# Patient Record
Sex: Male | Born: 1960
Health system: Southern US, Community
[De-identification: ages and names within clinical notes are randomized; demographics above are authoritative.]

## PROBLEM LIST (undated history)

## (undated) ENCOUNTER — Ambulatory Visit (HOSPITAL_COMMUNITY): Admission: EM | Payer: 59

## (undated) DIAGNOSIS — I10 Essential (primary) hypertension: Secondary | ICD-10-CM

## (undated) DIAGNOSIS — M549 Dorsalgia, unspecified: Secondary | ICD-10-CM

## (undated) HISTORY — PX: LEG SURGERY: SHX1003

---

## 1999-02-01 ENCOUNTER — Encounter: Admission: RE | Admit: 1999-02-01 | Discharge: 1999-03-16 | Payer: Self-pay | Admitting: Orthopedic Surgery

## 2001-03-15 ENCOUNTER — Emergency Department (HOSPITAL_COMMUNITY): Admission: EM | Admit: 2001-03-15 | Discharge: 2001-03-15 | Payer: Self-pay | Admitting: Emergency Medicine

## 2001-04-11 ENCOUNTER — Emergency Department (HOSPITAL_COMMUNITY): Admission: EM | Admit: 2001-04-11 | Discharge: 2001-04-11 | Payer: Self-pay | Admitting: Emergency Medicine

## 2001-05-10 ENCOUNTER — Emergency Department (HOSPITAL_COMMUNITY): Admission: EM | Admit: 2001-05-10 | Discharge: 2001-05-11 | Payer: Self-pay | Admitting: Emergency Medicine

## 2001-09-13 ENCOUNTER — Emergency Department (HOSPITAL_COMMUNITY): Admission: EM | Admit: 2001-09-13 | Discharge: 2001-09-13 | Payer: Self-pay | Admitting: Emergency Medicine

## 2002-04-01 ENCOUNTER — Emergency Department (HOSPITAL_COMMUNITY): Admission: EM | Admit: 2002-04-01 | Discharge: 2002-04-01 | Payer: Self-pay | Admitting: *Deleted

## 2004-07-12 ENCOUNTER — Ambulatory Visit (HOSPITAL_COMMUNITY): Admission: RE | Admit: 2004-07-12 | Discharge: 2004-07-12 | Payer: Self-pay | Admitting: Cardiovascular Disease

## 2004-11-16 ENCOUNTER — Emergency Department (HOSPITAL_COMMUNITY): Admission: EM | Admit: 2004-11-16 | Discharge: 2004-11-16 | Payer: Self-pay | Admitting: Emergency Medicine

## 2004-11-21 ENCOUNTER — Emergency Department (HOSPITAL_COMMUNITY): Admission: EM | Admit: 2004-11-21 | Discharge: 2004-11-21 | Payer: Self-pay | Admitting: Emergency Medicine

## 2005-05-24 ENCOUNTER — Emergency Department (HOSPITAL_COMMUNITY): Admission: EM | Admit: 2005-05-24 | Discharge: 2005-05-24 | Payer: Self-pay | Admitting: Emergency Medicine

## 2005-06-15 ENCOUNTER — Emergency Department (HOSPITAL_COMMUNITY): Admission: EM | Admit: 2005-06-15 | Discharge: 2005-06-15 | Payer: Self-pay | Admitting: Emergency Medicine

## 2008-09-16 ENCOUNTER — Emergency Department (HOSPITAL_COMMUNITY): Admission: EM | Admit: 2008-09-16 | Discharge: 2008-09-16 | Payer: Self-pay | Admitting: Emergency Medicine

## 2008-10-20 ENCOUNTER — Emergency Department (HOSPITAL_COMMUNITY): Admission: EM | Admit: 2008-10-20 | Discharge: 2008-10-20 | Payer: Self-pay | Admitting: Emergency Medicine

## 2010-06-26 ENCOUNTER — Emergency Department (HOSPITAL_COMMUNITY): Admission: EM | Admit: 2010-06-26 | Discharge: 2010-06-26 | Payer: Self-pay | Admitting: Emergency Medicine

## 2011-08-25 LAB — DIFFERENTIAL
Eosinophils Relative: 3 % (ref 0–5)
Lymphs Abs: 2.2 10*3/uL (ref 0.7–4.0)
Neutrophils Relative %: 75 % (ref 43–77)

## 2011-08-25 LAB — URINALYSIS, ROUTINE W REFLEX MICROSCOPIC
Bilirubin Urine: NEGATIVE
Glucose, UA: NEGATIVE mg/dL
Ketones, ur: NEGATIVE mg/dL
Specific Gravity, Urine: 1.018 (ref 1.005–1.030)
pH: 6 (ref 5.0–8.0)

## 2011-08-25 LAB — COMPREHENSIVE METABOLIC PANEL
Alkaline Phosphatase: 67 U/L (ref 39–117)
CO2: 29 mEq/L (ref 19–32)
Calcium: 9.3 mg/dL (ref 8.4–10.5)
Chloride: 106 mEq/L (ref 96–112)
Creatinine, Ser: 0.93 mg/dL (ref 0.4–1.5)
GFR calc non Af Amer: >60 mL/min (ref >60)
Glucose, Bld: 90 mg/dL (ref 70–99)
Potassium: 3.8 mEq/L (ref 3.5–5.1)
Total Protein: 6.9 g/dL (ref 6.0–8.3)

## 2011-08-25 LAB — CBC
HCT: 45.2 % (ref 39.0–52.0)
Hemoglobin: 15.2 g/dL (ref 13.0–17.0)
MCHC: 33.6 g/dL (ref 30.0–36.0)
RBC: 4.66 MIL/uL (ref 4.22–5.81)
WBC: 14.9 10*3/uL — ABNORMAL HIGH (ref 4.0–10.5)

## 2012-09-16 ENCOUNTER — Emergency Department (HOSPITAL_COMMUNITY)
Admission: EM | Admit: 2012-09-16 | Discharge: 2012-09-17 | Disposition: A | Discharge status: Home / Self Care | Payer: Medicare Other | Attending: Emergency Medicine | Admitting: Emergency Medicine

## 2012-09-16 ENCOUNTER — Encounter (HOSPITAL_COMMUNITY): Payer: Self-pay | Admitting: Cardiology

## 2012-09-16 ENCOUNTER — Emergency Department (HOSPITAL_COMMUNITY): Payer: Medicare Other

## 2012-09-16 DIAGNOSIS — F17200 Nicotine dependence, unspecified, uncomplicated: Secondary | ICD-10-CM | POA: Insufficient documentation

## 2012-09-16 DIAGNOSIS — Z79899 Other long term (current) drug therapy: Secondary | ICD-10-CM | POA: Insufficient documentation

## 2012-09-16 DIAGNOSIS — K5792 Diverticulitis of intestine, part unspecified, without perforation or abscess without bleeding: Secondary | ICD-10-CM

## 2012-09-16 DIAGNOSIS — K5732 Diverticulitis of large intestine without perforation or abscess without bleeding: Secondary | ICD-10-CM | POA: Insufficient documentation

## 2012-09-16 DIAGNOSIS — R42 Dizziness and giddiness: Secondary | ICD-10-CM | POA: Insufficient documentation

## 2012-09-16 LAB — URINALYSIS, ROUTINE W REFLEX MICROSCOPIC
Bilirubin Urine: NEGATIVE
Glucose, UA: NEGATIVE mg/dL
Ketones, ur: NEGATIVE mg/dL
Urobilinogen, UA: 0.2 mg/dL (ref 0.0–1.0)

## 2012-09-16 LAB — BASIC METABOLIC PANEL
BUN: 15 mg/dL (ref 6–23)
CO2: 25 mEq/L (ref 19–32)
Chloride: 105 mEq/L (ref 96–112)
Creatinine, Ser: 0.9 mg/dL (ref 0.50–1.35)
GFR calc non Af Amer: >90 mL/min (ref >90)
Glucose, Bld: 101 mg/dL — ABNORMAL HIGH (ref 70–99)
Potassium: 3.9 mEq/L (ref 3.5–5.1)
Sodium: 140 mEq/L (ref 135–145)

## 2012-09-16 LAB — CBC
HCT: 45.4 % (ref 39.0–52.0)
MCH: 33 pg (ref 26.0–34.0)
MCHC: 35 g/dL (ref 30.0–36.0)

## 2012-09-16 MED ORDER — METRONIDAZOLE 500 MG PO TABS
500.0000 mg | ORAL_TABLET | Freq: Once | ORAL | Status: AC
  Administered 2012-09-17: 500 mg via ORAL
  Filled 2012-09-16: qty 1

## 2012-09-16 MED ORDER — CIPROFLOXACIN IN D5W 400 MG/200ML IV SOLN: 400.0000 mg | Freq: Once | INTRAVENOUS | Status: DC

## 2012-09-16 MED ORDER — MORPHINE SULFATE 4 MG/ML IJ SOLN
4.0000 mg | Freq: Once | INTRAMUSCULAR | Status: AC
  Administered 2012-09-16: 4 mg via INTRAVENOUS
  Filled 2012-09-16: qty 1

## 2012-09-16 MED ORDER — CIPROFLOXACIN HCL 500 MG PO TABS
500.0000 mg | ORAL_TABLET | Freq: Once | ORAL | Status: AC
  Administered 2012-09-17: 500 mg via ORAL
  Filled 2012-09-16: qty 1

## 2012-09-16 MED ORDER — METRONIDAZOLE IN NACL 5-0.79 MG/ML-% IV SOLN: 500.0000 mg | Freq: Once | INTRAVENOUS | Status: DC

## 2012-09-16 MED ORDER — IOHEXOL 300 MG/ML  SOLN
100.0000 mL | Freq: Once | INTRAMUSCULAR | Status: AC | PRN
  Administered 2012-09-16: 100 mL via INTRAVENOUS

## 2012-09-16 MED ORDER — IOHEXOL 300 MG/ML  SOLN
20.0000 mL | INTRAMUSCULAR | Status: AC
  Administered 2012-09-16 – 2012-09-17 (×2): 20 mL via ORAL

## 2012-09-16 NOTE — ED Notes (Signed)
Pt reports lower quadrant abd pain that started last week. Reports increased urination and pain into the left flank area. Denies any n/v, reports normal bowel movement.

## 2012-09-16 NOTE — ED Notes (Addendum)
MD at bedside. 

## 2012-09-16 NOTE — ED Provider Notes (Signed)
History     CSN: [id number]  Arrival date & time 09/16/12  1734   First MD Initiated Contact with Patient 09/16/12 2039      Chief Complaint  Patient presents with  . Abdominal Pain    (Consider location/radiation/quality/duration/timing/severity/associated sxs/prior treatment) Patient is a 51 y.o. male presenting with abdominal pain. The history is provided by the patient. No language interpreter was used.  Abdominal Pain The primary symptoms of the illness include abdominal pain. The primary symptoms of the illness do not include fever, fatigue, shortness of breath, nausea, vomiting, diarrhea, hematemesis, hematochezia or dysuria. The current episode started more than 2 days ago. The onset of the illness was gradual. The problem has been gradually worsening.  The abdominal pain began more than 2 days ago. The pain came on gradually. The abdominal pain has been gradually worsening since its onset. The abdominal pain is located in the LLQ. The abdominal pain radiates to the left flank. The severity of the abdominal pain is 6/10. The abdominal pain is relieved by nothing. The abdominal pain is exacerbated by movement.  The patient has not had a change in bowel habit. Symptoms associated with the illness do not include chills, anorexia, diaphoresis, constipation or back pain. Significant associated medical issues do not include PUD, GERD, inflammatory bowel disease or diabetes.    History reviewed. No pertinent past medical history.  History reviewed. No pertinent past surgical history.  History reviewed. No pertinent family history.  History  Substance Use Topics  . Smoking status: Current Every Day Smoker  . Smokeless tobacco: Not on file  . Alcohol Use: Yes      Review of Systems  Constitutional: Negative for fever, chills, diaphoresis, activity change, appetite change and fatigue.  HENT: Negative for sore throat and neck pain.   Eyes: Negative for discharge and visual  disturbance.  Respiratory: Negative for cough, choking and shortness of breath.   Cardiovascular: Negative for chest pain and leg swelling.  Gastrointestinal: Positive for abdominal pain. Negative for nausea, vomiting, diarrhea, constipation, hematochezia, anorexia and hematemesis.  Genitourinary: Negative for dysuria and difficulty urinating.  Musculoskeletal: Negative for back pain and arthralgias.  Skin: Negative for color change and pallor.  Neurological: Positive for light-headedness (worse when standing). Negative for dizziness and speech difficulty.  Psychiatric/Behavioral: Negative for behavioral problems and agitation.  All other systems reviewed and are negative.    Allergies  Review of patient's allergies indicates no known allergies.  Home Medications   Current Outpatient Rx  Name Route Sig Dispense Refill  . CALCIUM CARBONATE ANTACID 500 MG PO CHEW Oral Chew 1 tablet by mouth daily.    Marland Kitchen ESOMEPRAZOLE MAGNESIUM 40 MG PO CPDR Oral Take 40 mg by mouth daily before breakfast.      BP 149/93  Pulse 72  Temp 98.1 F (36.7 C) (Oral)  Resp 22  SpO2 95%  Physical Exam  Constitutional: He appears well-developed. No distress.  HENT:  Head: Normocephalic and atraumatic.  Mouth/Throat: No oropharyngeal exudate.  Eyes: EOM are normal. Pupils are equal, round, and reactive to light. Right eye exhibits no discharge. Left eye exhibits no discharge.  Neck: Normal range of motion. Neck supple. No JVD present.  Cardiovascular: Normal rate, regular rhythm and normal heart sounds.   Pulmonary/Chest: Effort normal and breath sounds normal. No stridor. No respiratory distress. He has no wheezes. He has no rales. He exhibits no tenderness.  Abdominal: Soft. Bowel sounds are normal. He exhibits no distension and no mass. There  is tenderness (LLQ, moderate). There is no rebound and no guarding.  Genitourinary: Penis normal.  Musculoskeletal: Normal range of motion. He exhibits no edema  and no tenderness.  Neurological: He is alert. No cranial nerve deficit. He exhibits normal muscle tone.  Skin: Skin is warm and dry. He is not diaphoretic. No erythema. No pallor.  Psychiatric: He has a normal mood and affect. His behavior is normal. Judgment and thought content normal.    ED Course  Procedures (including critical care time)  Labs Reviewed  CBC - Abnormal; Notable for the following:    WBC 13.0 (*)     All other components within normal limits  BASIC METABOLIC PANEL - Abnormal; Notable for the following:    Glucose, Bld 101 (*)     All other components within normal limits  URINALYSIS, ROUTINE W REFLEX MICROSCOPIC   Ct Abdomen Pelvis W Contrast  09/16/2012  *RADIOLOGY REPORT*  Clinical Data: Lower quadrant abdominal pain left flank pain.  CT ABDOMEN AND PELVIS WITH CONTRAST  Technique:  Multidetector CT imaging of the abdomen and pelvis was performed following the standard protocol during bolus administration of intravenous contrast.  Contrast: OMNIPAQUE IOHEXOL 300 MG/ML  SOLN  Comparison: CT abdomen pelvis 10/20/2008.  Findings: Minimal atelectasis is present at the lung bases.  The heart size is normal.  No significant pleural or pericardial effusion is present.  Diffuse fatty infiltration of the liver is evident.  The spleen is unremarkable.  The stomach, duodenum, and pancreas are within normal limits the common bile duct and gallbladder are normal.  The adrenal glands are normal bilaterally.  The kidneys and ureters are within normal limits.  The urinary bladder is unremarkable.  Diverticular changes are present within the sigmoid colon.  There is focal inflammation associated with the descending colon compatible with a focal acute colitis.  There is no free air or fluid to suggest perforation or abscess.  The more proximal colon is unremarkable.  The appendix is visualized and normal.  The small bowel is unremarkable.  Atherosclerotic calcifications are present in  the abdominal aorta branch vessels without aneurysm.  A small amount of fat is herniated into the left inguinal canal.  There is no associated bowel.  Bone windows demonstrate bilateral L5 pars defects.  No significant anterolisthesis is evident.  IMPRESSION: 1.  Focal descending colitis likely reflects diverticulitis. 2.  Additional diverticular changes within the sigmoid colon without other areas of diverticulitis. 3.  Diffuse fatty infiltration of the liver. 4.  Bilateral L5 pars defects without significant anterolisthesis. 5.  Mild atherosclerotic change.   Original Report Authenticated By: Jamesetta Orleans. MATTERN, M.D.      1. Diverticulitis       MDM  8:57 PM patient presents with abdominal pain gradually worsening over the past several days. He localizes it to his left lower quadrant, radiating to his left flank. He's had no other associated symptoms. Review of systems only positive for mild lightheadedness upon standing. Denies vertigo. On exam patient is alert, no acute distress, normal vital signs, mild to moderate tenderness to palpation to left lower quadrant of abdomen. Does not have any guarding, rigidity, peritoneal signs. I have considered and doubt potential causes of abdominal pain including bowel obstruction, bowel perforation, pancreatitis, appendicitis, cholecystitis, intraabdominal abscess, abdominal aortic aneurysm, mesenteric ischemia and hernia.   12:03 AM CT consistent with diverticulitis. Will do course of Cipro and Flagyl for 10 days. Pt deemed stable for discharge. Return precautions were provided and  pt expressed understanding to return to ED if any acute symptoms return. Follow up was instructed which pt also expressed understanding. All questions were answered and pt was in agreement w/ plan.       Warrick Parisian, MD 09/17/12 0003

## 2012-09-17 MED ORDER — CIPROFLOXACIN HCL 500 MG PO TABS: 500.0000 mg | ORAL_TABLET | Freq: Two times a day (BID) | ORAL | Status: DC

## 2012-09-17 MED ORDER — HYDROCODONE-ACETAMINOPHEN 5-325 MG PO TABS: 2.0000 | ORAL_TABLET | Freq: Four times a day (QID) | ORAL | Status: DC | PRN

## 2012-09-17 MED ORDER — DOCUSATE SODIUM 100 MG PO CAPS
100.0000 mg | ORAL_CAPSULE | Freq: Two times a day (BID) | ORAL | Status: DC
Start: 2012-09-17 — End: 2013-08-28

## 2012-09-17 MED ORDER — IBUPROFEN 800 MG PO TABS: 800.0000 mg | ORAL_TABLET | Freq: Three times a day (TID) | ORAL | Status: DC

## 2012-09-17 MED ORDER — METRONIDAZOLE 500 MG PO TABS: 500.0000 mg | ORAL_TABLET | Freq: Three times a day (TID) | ORAL | Status: DC

## 2012-09-17 NOTE — ED Provider Notes (Addendum)
I saw and evaluated the patient, reviewed the resident's note and I agree with the findings and plan.  The patient is well-appearing.  She does have a fever mild tenderness in the right lower quadrant.  CT abdomen pelvis pending.  Summary symptoms sound viral in nature and I suspect that the patient's CT scan is normal then she can go home safely.  She has a primary care Dr. and she guarantees me that she will followup with her primary care physician within 24 hours or return to the ER in 24 hours for recheck.  But the patient and her daughter are agreeable to this plan.  Care to Dr. Dierdre Highman for follow up on CT scan.   Lyanne Co, MD 09/17/12 1610  Lyanne Co, MD 09/17/12 [phone]

## 2013-08-28 ENCOUNTER — Encounter (HOSPITAL_COMMUNITY): Payer: Self-pay | Admitting: Emergency Medicine

## 2013-08-28 ENCOUNTER — Emergency Department (HOSPITAL_COMMUNITY): Payer: Medicare Other

## 2013-08-28 ENCOUNTER — Emergency Department (HOSPITAL_COMMUNITY)
Admission: EM | Admit: 2013-08-28 | Discharge: 2013-08-28 | Disposition: A | Discharge status: Home / Self Care | Payer: Medicare Other | Attending: Emergency Medicine | Admitting: Emergency Medicine

## 2013-08-28 DIAGNOSIS — M25511 Pain in right shoulder: Secondary | ICD-10-CM

## 2013-08-28 DIAGNOSIS — F17200 Nicotine dependence, unspecified, uncomplicated: Secondary | ICD-10-CM | POA: Insufficient documentation

## 2013-08-28 DIAGNOSIS — S46909A Unspecified injury of unspecified muscle, fascia and tendon at shoulder and upper arm level, unspecified arm, initial encounter: Secondary | ICD-10-CM | POA: Insufficient documentation

## 2013-08-28 DIAGNOSIS — S4980XA Other specified injuries of shoulder and upper arm, unspecified arm, initial encounter: Secondary | ICD-10-CM | POA: Insufficient documentation

## 2013-08-28 DIAGNOSIS — Y9389 Activity, other specified: Secondary | ICD-10-CM | POA: Insufficient documentation

## 2013-08-28 DIAGNOSIS — Y929 Unspecified place or not applicable: Secondary | ICD-10-CM | POA: Insufficient documentation

## 2013-08-28 DIAGNOSIS — X503XXA Overexertion from repetitive movements, initial encounter: Secondary | ICD-10-CM | POA: Insufficient documentation

## 2013-08-28 DIAGNOSIS — Z792 Long term (current) use of antibiotics: Secondary | ICD-10-CM | POA: Insufficient documentation

## 2013-08-28 LAB — BASIC METABOLIC PANEL
BUN: 13 mg/dL (ref 6–23)
CO2: 28 mEq/L (ref 19–32)
Chloride: 102 mEq/L (ref 96–112)
GFR calc Af Amer: >90 mL/min (ref >90)
Glucose, Bld: 103 mg/dL — ABNORMAL HIGH (ref 70–99)
Potassium: 4.1 mEq/L (ref 3.5–5.1)
Sodium: 139 mEq/L (ref 135–145)

## 2013-08-28 LAB — CBC WITH DIFFERENTIAL/PLATELET
Basophils Relative: 0 % (ref 0–1)
Eosinophils Absolute: 0.7 10*3/uL (ref 0.0–0.7)
Eosinophils Relative: 6 % — ABNORMAL HIGH (ref 0–5)
HCT: 42.5 % (ref 39.0–52.0)
Hemoglobin: 14.8 g/dL (ref 13.0–17.0)
Lymphocytes Relative: 24 % (ref 12–46)
Lymphs Abs: 2.7 10*3/uL (ref 0.7–4.0)
MCH: 32.2 pg (ref 26.0–34.0)
MCHC: 34.8 g/dL (ref 30.0–36.0)
Neutro Abs: 6.7 10*3/uL (ref 1.7–7.7)
RBC: 4.59 MIL/uL (ref 4.22–5.81)

## 2013-08-28 LAB — TROPONIN I: Troponin I: <0.3 ng/mL (ref <0.30)

## 2013-08-28 MED ORDER — IBUPROFEN 800 MG PO TABS: 800.0000 mg | ORAL_TABLET | Freq: Three times a day (TID) | ORAL | Status: DC

## 2013-08-28 MED ORDER — IBUPROFEN 800 MG PO TABS
800.0000 mg | ORAL_TABLET | Freq: Once | ORAL | Status: AC
  Administered 2013-08-28: 800 mg via ORAL
  Filled 2013-08-28: qty 1

## 2013-08-28 MED ORDER — OXYCODONE-ACETAMINOPHEN 5-325 MG PO TABS
2.0000 | ORAL_TABLET | Freq: Once | ORAL | Status: DC
  Filled 2013-08-28: qty 2

## 2013-08-28 MED ORDER — OXYCODONE-ACETAMINOPHEN 5-325 MG PO TABS
2.0000 | ORAL_TABLET | Freq: Once | ORAL | Status: AC
  Administered 2013-08-28: 2 via ORAL
  Filled 2013-08-28: qty 2

## 2013-08-28 MED ORDER — OXYCODONE-ACETAMINOPHEN 5-325 MG PO TABS: 2.0000 | ORAL_TABLET | ORAL | Status: DC | PRN

## 2013-08-28 MED ORDER — HYDROMORPHONE HCL PF 1 MG/ML IJ SOLN
1.0000 mg | Freq: Once | INTRAMUSCULAR | Status: AC
  Administered 2013-08-28: 1 mg via INTRAMUSCULAR
  Filled 2013-08-28: qty 1

## 2013-08-28 NOTE — ED Notes (Signed)
Phlebotomy at bedside.

## 2013-08-28 NOTE — ED Provider Notes (Signed)
CSN: [id number]     Arrival date & time 08/28/13  0225 History   First MD Initiated Contact with Patient 08/28/13 [phone]     Chief Complaint  Patient presents with  . Arm Pain   (Consider location/radiation/quality/duration/timing/severity/associated sxs/prior Treatment) HPI Comments: Patient complains of right upper arm pain for the past 4 days it has been constant and worse with movement. Onset after he was lifting a heavy table over the weekend. The pain is in the front and back of his upper right arm. Denies any chest pain, neck pain, difficulty breathing or abdominal pain. Denies any numbness or tingling. Feels weak because of pain. He is not taking anything at home for pain.  The history is provided by the patient.    History reviewed. No pertinent past medical history. Past Surgical History  Procedure Laterality Date  . Leg surgery    . Neck surgery     No family history on file. History  Substance Use Topics  . Smoking status: Current Every Day Smoker  . Smokeless tobacco: Not on file  . Alcohol Use: Yes    Review of Systems  Constitutional: Negative for fever, activity change and appetite change.  Respiratory: Negative for cough, chest tightness and shortness of breath.   Cardiovascular: Negative for chest pain.  Gastrointestinal: Negative for nausea, vomiting and abdominal pain.  Genitourinary: Negative for dysuria and hematuria.  Musculoskeletal: Positive for arthralgias and myalgias. Negative for back pain and neck pain.  Skin: Negative for rash.  Neurological: Negative for dizziness and headaches.  A complete 10 system review of systems was obtained and all systems are negative except as noted in the HPI and PMH.    Allergies  Review of patient's allergies indicates no known allergies.  Home Medications   Current Outpatient Rx  Name  Route  Sig  Dispense  Refill  . aspirin 325 MG tablet   Oral   Take 325 mg by mouth every 4 (four) hours as needed for pain.         . calcium carbonate (TUMS - DOSED IN MG ELEMENTAL CALCIUM) 500 MG chewable tablet   Oral   Chew 1 tablet by mouth daily as needed for heartburn.          . ciprofloxacin (CIPRO) 500 MG tablet   Oral   Take 1 tablet (500 mg total) by mouth 2 (two) times daily.   20 tablet   0   . ibuprofen (ADVIL,MOTRIN) 800 MG tablet   Oral   Take 1 tablet (800 mg total) by mouth 3 (three) times daily.   21 tablet   0   . oxyCODONE-acetaminophen (PERCOCET/ROXICET) 5-325 MG per tablet   Oral   Take 2 tablets by mouth every 4 (four) hours as needed for pain.   15 tablet   0    BP 161/93  Pulse 80  Temp(Src) 97.8 F (36.6 C) (Oral)  Resp 25  SpO2 95% Physical Exam  Constitutional: He is oriented to person, place, and time. He appears well-developed and well-nourished. No distress.  HENT:  Head: Normocephalic and atraumatic.  Mouth/Throat: Oropharynx is clear and moist. No oropharyngeal exudate.  Eyes: Conjunctivae and EOM are normal. Pupils are equal, round, and reactive to light.  Neck: Normal range of motion. Neck supple.  Cardiovascular: Normal rate, regular rhythm and normal heart sounds.   No murmur heard. Pulmonary/Chest: Effort normal and breath sounds normal. No respiratory distress.  Abdominal: Soft. There is no tenderness. There is no  rebound and no guarding.  Musculoskeletal: Normal range of motion. He exhibits tenderness. He exhibits no edema.  Tender to palpation over right bicep and tricep. Flexion and extension of elbow intact. Reduced range of motion of right shoulder secondary to pain. +2 radial pulse. Cardinal hand movements intact. Nerve sensation intact. Biceps tendon appears intact.  Neurological: He is alert and oriented to person, place, and time. No cranial nerve deficit. He exhibits normal muscle tone. Coordination normal.  Skin: Skin is warm.    ED Course  Procedures (including critical care time) Labs Review Labs Reviewed  CBC WITH DIFFERENTIAL -  Abnormal; Notable for the following:    WBC 11.2 (*)    Eosinophils Relative 6 (*)    Monocytes Absolute 1.1 (*)    All other components within normal limits  BASIC METABOLIC PANEL - Abnormal; Notable for the following:    Glucose, Bld 103 (*)    All other components within normal limits  TROPONIN I   Imaging Review Dg Shoulder Right  08/28/2013   *RADIOLOGY REPORT*  Clinical Data: Injury to the right arm while lifting object; right arm pain.  RIGHT SHOULDER - 2+ VIEW  Comparison: None.  Findings: There is no evidence of fracture or dislocation.  The right humeral head is seated within the glenoid fossa.  The acromioclavicular joint is unremarkable in appearance.  No significant soft tissue abnormalities are seen.  The visualized portions of the right lung are clear.  IMPRESSION: No evidence of fracture or dislocation.   Original Report Authenticated By: Tonia Ghent, M.D.   Dg Elbow Complete Right  08/28/2013   *RADIOLOGY REPORT*  Clinical Data: Injury to arm while lifting an object.  Pain at the right elbow.  RIGHT ELBOW - COMPLETE 3+ VIEW  Comparison: None.  Findings: There is no evidence of fracture or dislocation.  The visualized joint spaces are preserved.  No significant joint effusion is identified.  The soft tissues are unremarkable in appearance.  IMPRESSION: No evidence of fracture or dislocation.   Original Report Authenticated By: Tonia Ghent, M.D.    MDM   1. Shoulder pain, right    5 day history of arm pain after lifting table. Neurovascular intact. Sensation intact.  EKG nonischemic without comparison. Xrays negative for fracture.  Forearm flexion and extension intact. Pronation, supination intact. Reduced ROM of R shoulder 2/2 pain. Sling, analgesics, f/u with ortho. Early ROM exercises discussed.    Date: 08/28/2013  Rate: 71  Rhythm: normal sinus rhythm  QRS Axis: normal  Intervals: normal  ST/T Wave abnormalities: nonspecific T wave changes  Conduction  Disutrbances:none  Narrative Interpretation: T wave inversion lead III  Old EKG Reviewed: none available    Glynn Octave, MD 08/28/13 [phone]

## 2013-08-28 NOTE — ED Notes (Signed)
Pt. reports right arm pain onset 5 days ago after lifting heavy table.

## 2013-08-28 NOTE — ED Notes (Signed)
PT did not want PO pain medications, MD informed. See new orders.

## 2013-08-28 NOTE — ED Notes (Signed)
MD informed pt is in a lot of pain still.

## 2014-09-01 ENCOUNTER — Encounter (HOSPITAL_COMMUNITY): Payer: Self-pay | Admitting: Emergency Medicine

## 2014-09-01 ENCOUNTER — Emergency Department (HOSPITAL_COMMUNITY)
Admission: EM | Admit: 2014-09-01 | Discharge: 2014-09-02 | Disposition: A | Discharge status: Home / Self Care | Payer: Medicare Other | Attending: Emergency Medicine | Admitting: Emergency Medicine

## 2014-09-01 DIAGNOSIS — Z720 Tobacco use: Secondary | ICD-10-CM | POA: Diagnosis not present

## 2014-09-01 DIAGNOSIS — M5136 Other intervertebral disc degeneration, lumbar region: Secondary | ICD-10-CM | POA: Insufficient documentation

## 2014-09-01 DIAGNOSIS — M549 Dorsalgia, unspecified: Secondary | ICD-10-CM | POA: Diagnosis present

## 2014-09-01 HISTORY — DX: Dorsalgia, unspecified: M54.9

## 2014-09-01 MED ORDER — KETOROLAC TROMETHAMINE 30 MG/ML IJ SOLN
30.0000 mg | Freq: Once | INTRAMUSCULAR | Status: AC
  Administered 2014-09-02: 30 mg via INTRAMUSCULAR
  Filled 2014-09-01: qty 1

## 2014-09-01 MED ORDER — MORPHINE SULFATE 4 MG/ML IJ SOLN
4.0000 mg | Freq: Once | INTRAMUSCULAR | Status: AC
  Administered 2014-09-02: 4 mg via INTRAMUSCULAR
  Filled 2014-09-01: qty 1

## 2014-09-01 NOTE — ED Provider Notes (Signed)
CSN: [id number]     Arrival date & time 09/01/14  2111 History   This chart was scribed for non-physician practitioner Harvie Heck, PA-C,  working with Delora Fuel, MD, by Neta Ehlers, ED Scribe. This patient was seen in room TR11C/TR11C and the patient's care was started at 11:43 PM.  None    Chief Complaint  Patient presents with  . Back Pain    HPI Comments: Derek Carroll is a 53 y.o. male who presents to the Emergency Department complaining of constant lumbar pain which began approximately six hours ago. 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 He reports the pain is worse with movement, weight-bearing, sitting upright, and ambulation. He states the pain is similar to previous back pain, known degenerative changes from OSH XR. Marland Kitchen He has used IBU without resolution.  He denies weakness or numbness to his legs, no radiation into lower extrmities. He also denies nausea, emesis, abdominal pain, fever, bladder or bowel incontinence, dysuria or hematuria.    The history is provided by the patient. No language interpreter was used.    Past Medical History  Diagnosis Date  . Back pain    Past Surgical History  Procedure Laterality Date  . Leg surgery     History reviewed. No pertinent family history. History  Substance Use Topics  . Smoking status: Current Every Day Smoker  . Smokeless tobacco: Not on file  . Alcohol Use: Yes    Review of Systems  Constitutional: Negative for fever and chills.  Cardiovascular: Negative for chest pain.  Gastrointestinal: Negative for nausea, vomiting and abdominal pain.  Genitourinary: Negative for dysuria and hematuria.  Musculoskeletal: Positive for back pain.  Neurological: Negative for weakness and numbness.  All other systems reviewed and are negative.     Allergies  Review of patient's allergies indicates no known allergies.  Home Medications   Prior to Admission medications   Medication Sig  Start Date End Date Taking? Authorizing Provider  ibuprofen (ADVIL,MOTRIN) 200 MG tablet Take 200 mg by mouth every 6 (six) hours as needed for mild pain.   Yes Historical Provider, MD   Triage Vitals: BP 151/96  Pulse 84  Temp(Src) 98.1 F (36.7 C) (Oral)  Resp 20  Ht 5\' 10"  (1.778 m)  Wt 225 lb (102.059 kg)  BMI 32.28 kg/m2  SpO2 93%  Physical Exam  Nursing note and vitals reviewed. Constitutional: He is oriented to person, place, and time. He appears well-developed and well-nourished. No distress.  HENT:  Head: Normocephalic and atraumatic.  Eyes: Conjunctivae and EOM are normal.  Neck: Neck supple.  Pulmonary/Chest: Effort normal. No respiratory distress.  Musculoskeletal: Normal range of motion.       Back:  Diffuse tenderness to lower back. No obvious deformity.  Spasm noted in Left lower back. Normal sensation and strength to lower extremities.  Neurological: He is alert and oriented to person, place, and time.  Skin: Skin is warm and dry.  Psychiatric: He has a normal mood and affect. His behavior is normal.    ED Course  Procedures (including critical care time)   COORDINATION OF CARE:  11:29 PM- Discussed treatment plan with patient, and the patient agreed to the plan.    Labs Review Labs Reviewed - No data to display  Imaging Review Dg Lumbar Spine Complete  09/02/2014   CLINICAL DATA:  Centralized low back pain. No recent injury. Initial encounter.  EXAM: LUMBAR SPINE - COMPLETE 4+ VIEW  COMPARISON:  CT abdomen pelvis - 09/16/2012  FINDINGS: There are 5 non rib-bearing lumbar type vertebral bodies. Normal alignment the lumbar spine. No anterolisthesis or retrolisthesis. No definite pars defects.  Lumbar vertebral body heights are preserved.  There is mild multilevel lumbar spine DDD, worse at L1-L2 and L2-L3 with disc space height loss, endplate irregularity and sclerosis.  Limited visualization the bilateral SI joints and hips in is normal.  Atherosclerotic  plaque within the abdominal aorta. Regional soft tissues appear otherwise normal.  IMPRESSION: 1. No acute findings. 2. Mild multilevel lumbar spine DDD, worse at L1-L2 and L2-L3.   Electronically Signed   By: Sandi Mariscal M.D.   On: 09/02/2014 01:12     EKG Interpretation None      MDM   Final diagnoses:  DDD (degenerative disc disease), lumbar   Patient presents with nontraumatic back pain, no radiation to lower extremity. Worsened with movement. Likely strain. Known history of degenerative disc disease, patient requesting something to "get rid of all the pain right now" and an XR. X-ray shows mostly lumbar spine DDD, worse at L1-L2 and L2-L3. Plan to discharge with anti-inflammatories, narcotic pain medication and followup with an back specialist. Discussed imaging results, and treatment plan with the patient. Return precautions given. Reports understanding and no other concerns at this time.  Patient is stable for discharge at this time.  Meds given in ED:  Medications  morphine 4 MG/ML injection 4 mg (4 mg Intramuscular Given 09/02/14 0020)  ketorolac (TORADOL) 30 MG/ML injection 30 mg (30 mg Intramuscular Given 09/02/14 0020)    Discharge Medication List as of 09/02/2014  1:28 AM    START taking these medications   Details  cyclobenzaprine (FLEXERIL) 10 MG tablet Take 1 tablet (10 mg total) by mouth at bedtime., Starting 09/02/2014, Until Discontinued, Print    HYDROcodone-acetaminophen (NORCO/VICODIN) 5-325 MG per tablet Take 1 tablet by mouth every 4 (four) hours as needed for moderate pain or severe pain., Starting 09/02/2014, Until Discontinued, Print    !! ibuprofen (ADVIL,MOTRIN) 800 MG tablet Take 1 tablet (800 mg total) by mouth 3 (three) times daily with meals., Starting 09/02/2014, Until Discontinued, Print     !! - Potential duplicate medications found. Please discuss with provider.     I personally performed the services described in this documentation, which was  scribed in my presence. The recorded information has been reviewed and is accurate.    Harvie Heck, PA-C 09/03/14 1538

## 2014-09-01 NOTE — ED Notes (Signed)
C/o low back pain after yard work all day. H/o similar. Worse with movement and walking. (denies: nvd, fever, bleeding, loss of control of bowel or bladder, urinary sx or other sx). Took ibuprofen 600mg  at 1730. Took 400mg  at 2030.

## 2014-09-02 ENCOUNTER — Emergency Department (HOSPITAL_COMMUNITY): Payer: Medicare Other

## 2014-09-02 MED ORDER — CYCLOBENZAPRINE HCL 10 MG PO TABS: 10.0000 mg | ORAL_TABLET | Freq: Every day | ORAL | Status: DC

## 2014-09-02 MED ORDER — HYDROCODONE-ACETAMINOPHEN 5-325 MG PO TABS: 1.0000 | ORAL_TABLET | ORAL | Status: DC | PRN

## 2014-09-02 MED ORDER — IBUPROFEN 800 MG PO TABS: 800.0000 mg | ORAL_TABLET | Freq: Three times a day (TID) | ORAL | Status: DC

## 2014-09-02 NOTE — ED Notes (Signed)
Back from xray, NAD, calm, alert, interactive, drinking soda.

## 2014-09-02 NOTE — ED Notes (Signed)
Alert, NAD, calm, interactive, watching TV, asked lights to be dimmed, drinking 2nd can of soda per request. EDPA in to see pt.

## 2014-09-02 NOTE — Discharge Instructions (Signed)
Call for a follow up appointment with a Family or Primary Care Provider.  °Return if Symptoms worsen.   °Take medication as prescribed.  ° °

## 2014-09-02 NOTE — ED Notes (Signed)
Pt alert, NAD, calm, interactive, resps e/u.

## 2014-09-04 NOTE — ED Provider Notes (Signed)
Medical screening examination/treatment/procedure(s) were performed by non-physician practitioner and as supervising physician I was immediately available for consultation/collaboration.     Delora Fuel, MD 50/03/70 [id number]

## 2015-06-11 ENCOUNTER — Emergency Department (HOSPITAL_COMMUNITY)
Admission: EM | Admit: 2015-06-11 | Discharge: 2015-06-11 | Disposition: A | Discharge status: Home / Self Care | Payer: Medicare Other | Attending: Emergency Medicine | Admitting: Emergency Medicine

## 2015-06-11 ENCOUNTER — Encounter (HOSPITAL_COMMUNITY): Payer: Self-pay | Admitting: Emergency Medicine

## 2015-06-11 ENCOUNTER — Emergency Department (HOSPITAL_COMMUNITY): Payer: Medicare Other

## 2015-06-11 DIAGNOSIS — Z79899 Other long term (current) drug therapy: Secondary | ICD-10-CM | POA: Diagnosis not present

## 2015-06-11 DIAGNOSIS — M25561 Pain in right knee: Secondary | ICD-10-CM | POA: Diagnosis present

## 2015-06-11 DIAGNOSIS — L02415 Cutaneous abscess of right lower limb: Secondary | ICD-10-CM | POA: Insufficient documentation

## 2015-06-11 DIAGNOSIS — Z720 Tobacco use: Secondary | ICD-10-CM | POA: Diagnosis not present

## 2015-06-11 MED ORDER — LIDOCAINE HCL (PF) 1 % IJ SOLN
5.0000 mL | Freq: Once | INTRAMUSCULAR | Status: AC
  Administered 2015-06-11: 5 mL via INTRADERMAL
  Filled 2015-06-11: qty 5

## 2015-06-11 MED ORDER — HYDROCODONE-ACETAMINOPHEN 5-325 MG PO TABS: 1.0000 | ORAL_TABLET | ORAL | Status: DC | PRN

## 2015-06-11 NOTE — ED Provider Notes (Signed)
CSN: [id number]     Arrival date & time 06/11/15  1036 History  This chart was scribed for non-physician practitioner, Larene Pickett, PA-C working with Orlie Dakin, MD by Rayna Sexton, ED scribe. This patient was seen in room TR11C/TR11C and the patient's care was started at 12:38 PM.  Chief Complaint  Patient presents with  . Knee Pain   The history is provided by the patient. No language interpreter was used.    HPI Comments: Derek Carroll is a 54 y.o. male who presents to the Emergency Department complaining of a point of swelling and redness to the anterior of his right knee with onset a few years ago and a recent worsening of his symptoms. He notes that we was recently working on his knees at [facility] and noticed a worsening of the swelling thereafter. Pt notes that he had a roommate with a similar symptom who was diagnosed with a cyst which he had drained in the ED. He denies any other associated symptoms.  No fever, chills, sweats.  No drainage from knee.  No difficulty ambulating.  Past Medical History  Diagnosis Date  . Back pain    Past Surgical History  Procedure Laterality Date  . Leg surgery     No family history on file. History  Substance Use Topics  . Smoking status: Current Every Day Smoker    Types: Cigarettes  . Smokeless tobacco: Not on file  . Alcohol Use: No     Comment: Quit January 2016    Review of Systems  Musculoskeletal: Positive for joint swelling.  All other systems reviewed and are negative.  Allergies  Review of patient's allergies indicates no known allergies.  Home Medications   Prior to Admission medications   Medication Sig Start Date End Date Taking? Authorizing Provider  cyclobenzaprine (FLEXERIL) 10 MG tablet Take 1 tablet (10 mg total) by mouth at bedtime. 09/02/14   Harvie Heck, PA-C  HYDROcodone-acetaminophen (NORCO/VICODIN) 5-325 MG per tablet Take 1 tablet by mouth every 4 (four) hours as needed for moderate pain or severe  pain. 09/02/14   Harvie Heck, PA-C  ibuprofen (ADVIL,MOTRIN) 200 MG tablet Take 200 mg by mouth every 6 (six) hours as needed for mild pain.    Historical Provider, MD  ibuprofen (ADVIL,MOTRIN) 800 MG tablet Take 1 tablet (800 mg total) by mouth 3 (three) times daily with meals. 09/02/14   Lauren Parker, PA-C   BP 160/95 mmHg  Pulse 88  Temp(Src) 98 F (36.7 C)  Resp 20  Ht 5\' 10"  (1.778 m)  Wt 225 lb (102.059 kg)  BMI 32.28 kg/m2  SpO2 94%   Physical Exam  Constitutional: He is oriented to person, place, and time. He appears well-developed and well-nourished. No distress.  HENT:  Head: Normocephalic and atraumatic.  Mouth/Throat: Oropharynx is clear and moist.  Eyes: Conjunctivae and EOM are normal. Pupils are equal, round, and reactive to light.  Neck: Normal range of motion. Neck supple.  Cardiovascular: Normal rate, regular rhythm and normal heart sounds.   Pulmonary/Chest: Breath sounds normal. No respiratory distress. He has no wheezes.  Abdominal: Soft. Bowel sounds are normal. There is no tenderness. There is no guarding.  Musculoskeletal: Normal range of motion.  Right knee with hardened area of lateral right knee; pre-patellar region seems soft but not fluctuant, no drainage; area is blanchable; no overlying skin changes or warmth to touch; full ROM without difficulty; normal gait  Neurological: He is alert and oriented to person, place, and  time.  Skin: Skin is warm and dry. He is not diaphoretic.  Psychiatric: He has a normal mood and affect.  Nursing note and vitals reviewed.   ED Course  Procedures  DIAGNOSTIC STUDIES: Oxygen Saturation is 94% on RA, adequate by my interpretation.    COORDINATION OF CARE: 12:42 PM Discussed treatment plan with pt at bedside and pt agreed to plan.  INCISION AND DRAINAGE PROCEDURE NOTE: Patient identification was confirmed and verbal consent was obtained. This procedure was performed by Vilinda Blanks. Baird Cancer, PA-C at 1:45 PM. Site:  right knee  Sterile procedures observed Needle size: 25 G Anesthetic used (type and amt): lidocaine 1% without epinephrine Blade size: 11 Drainage: small, bloody Complexity: Complex Packing used none Site anesthetized, incision made over site, wound drained and explored loculations, rinsed with copious amounts of normal saline, wound packed with sterile gauze, covered with dry, sterile dressing.  Pt tolerated procedure well without complications.  Instructions for care discussed verbally and pt provided with additional written instructions for homecare and f/u.  Labs Review Labs Reviewed - No data to display  Imaging Review Dg Knee Complete 4 Views Right  06/11/2015   CLINICAL DATA:  Cyst like projection on RIGHT knee lateral to apex of patella for several years, now red and painful after kneeling on it  EXAM: RIGHT KNEE - COMPLETE 4+ VIEW  COMPARISON:  None  FINDINGS: Mild osseous demineralization.  Joint spaces preserved.  No acute fracture, dislocation or bone destruction.  Small patellar spur at cranial margin of articular surface.  Focal soft tissue swelling anterolaterally at the level of the inferior pole patella, anterior to the patellar tendon.  No knee joint effusion.  IMPRESSION: Minimal patellofemoral degenerative changes.  Nonspecific focal soft tissue swelling/mass anterolaterally at the RIGHT knee at the level of the inferior pole of the patella, nonspecific appearance by radiography; may consider sonography or MR assessment for further evaluation if clinically indicated.   Electronically Signed   By: Lavonia Dana M.D.   On: 06/11/2015 12:08     EKG Interpretation None     MDM   Final diagnoses:  Knee pain, right   54 y.o. M with right knee swelling for the past several years, worse after working on his knee a few days ago.  No reported fever/chills.  Patient state he is concerned he has developed an abscess of his knee.  States friend with similar that was treated with  I&D.  Patient does have a hardened area of lateral right knee as well as some softness in his pre-patellar region without noted fluctuance. Does not appear to be classic bursitis.  No signs of septic joint.   I have discussed with patient that i do not necessarily feel this is abscess given that it has been here for over a year.  I have offered him orthopedic referral to further evaluation, however he is persistent that he wants I&D attempted.  I made a very small incision of right anterior knee (approx 0.5 cm), no purulent drainage noted.  Knee dressed, will refer to orthopedics for further management.  Short supply pain meds given.  Discussed plan with patient, he/she acknowledged understanding and agreed with plan of care.  Return precautions given for new or worsening symptoms.  I personally performed the services described in this documentation, which was scribed in my presence. The recorded information has been reviewed and is accurate.  Larene Pickett, PA-C 06/11/15 1416  Orlie Dakin, MD 06/11/15 1536

## 2015-06-11 NOTE — ED Notes (Signed)
Pt c/o possible abscess to right knee ongoing for years. Area blanchable and hard to touch. Pt ambulatory in triage.

## 2015-06-11 NOTE — Discharge Instructions (Signed)
Take the prescribed medication as directed. Follow-up with Dr. Mardelle Matte-- call to make appt. Return to the ED for new or worsening symptoms.

## 2016-03-15 ENCOUNTER — Encounter (HOSPITAL_COMMUNITY): Payer: Self-pay | Admitting: *Deleted

## 2016-03-15 ENCOUNTER — Emergency Department (HOSPITAL_COMMUNITY): Payer: Medicare Other

## 2016-03-15 ENCOUNTER — Emergency Department (HOSPITAL_COMMUNITY)
Admission: EM | Admit: 2016-03-15 | Discharge: 2016-03-15 | Disposition: A | Discharge status: Home / Self Care | Payer: Medicare Other | Attending: Emergency Medicine | Admitting: Emergency Medicine

## 2016-03-15 DIAGNOSIS — Y9389 Activity, other specified: Secondary | ICD-10-CM | POA: Insufficient documentation

## 2016-03-15 DIAGNOSIS — Y998 Other external cause status: Secondary | ICD-10-CM | POA: Insufficient documentation

## 2016-03-15 DIAGNOSIS — F17210 Nicotine dependence, cigarettes, uncomplicated: Secondary | ICD-10-CM | POA: Diagnosis not present

## 2016-03-15 DIAGNOSIS — Y9289 Other specified places as the place of occurrence of the external cause: Secondary | ICD-10-CM | POA: Insufficient documentation

## 2016-03-15 DIAGNOSIS — S4991XA Unspecified injury of right shoulder and upper arm, initial encounter: Secondary | ICD-10-CM | POA: Diagnosis present

## 2016-03-15 DIAGNOSIS — S46911A Strain of unspecified muscle, fascia and tendon at shoulder and upper arm level, right arm, initial encounter: Secondary | ICD-10-CM | POA: Insufficient documentation

## 2016-03-15 DIAGNOSIS — X500XXA Overexertion from strenuous movement or load, initial encounter: Secondary | ICD-10-CM | POA: Insufficient documentation

## 2016-03-15 LAB — CBG MONITORING, ED: Glucose-Capillary: 105 mg/dL — ABNORMAL HIGH (ref 65–99)

## 2016-03-15 MED ORDER — KETOROLAC TROMETHAMINE 60 MG/2ML IM SOLN
60.0000 mg | Freq: Once | INTRAMUSCULAR | Status: AC
  Administered 2016-03-15: 60 mg via INTRAMUSCULAR
  Filled 2016-03-15: qty 2

## 2016-03-15 MED ORDER — CYCLOBENZAPRINE HCL 10 MG PO TABS: 10.0000 mg | ORAL_TABLET | Freq: Two times a day (BID) | ORAL | Status: DC | PRN

## 2016-03-15 MED ORDER — DEXAMETHASONE SODIUM PHOSPHATE 10 MG/ML IJ SOLN
10.0000 mg | Freq: Once | INTRAMUSCULAR | Status: AC
  Administered 2016-03-15: 10 mg via INTRAMUSCULAR
  Filled 2016-03-15: qty 1

## 2016-03-15 MED ORDER — IBUPROFEN 800 MG PO TABS: 800.0000 mg | ORAL_TABLET | Freq: Three times a day (TID) | ORAL | Status: DC

## 2016-03-15 NOTE — Discharge Instructions (Signed)
Muscle Strain A muscle strain is an injury that occurs when a muscle is stretched beyond its normal length. Usually a small number of muscle fibers are torn when this happens. Muscle strain is rated in degrees. First-degree strains have the least amount of muscle fiber tearing and pain. Second-degree and third-degree strains have increasingly more tearing and pain.  Usually, recovery from muscle strain takes 1-2 weeks. Complete healing takes 5-6 weeks.  CAUSES  Muscle strain happens when a sudden, violent force placed on a muscle stretches it too far. This may occur with lifting, sports, or a fall.  RISK FACTORS Muscle strain is especially common in athletes.  SIGNS AND SYMPTOMS At the site of the muscle strain, there may be:  Pain.  Bruising.  Swelling.  Difficulty using the muscle due to pain or lack of normal function. DIAGNOSIS  Your health care provider will perform a physical exam and ask about your medical history. TREATMENT  Often, the best treatment for a muscle strain is resting, icing, and applying cold compresses to the injured area.  HOME CARE INSTRUCTIONS   Use the PRICE method of treatment to promote muscle healing during the first 2-3 days after your injury. The PRICE method involves:  Protecting the muscle from being injured again.  Restricting your activity and resting the injured body part.  Icing your injury. To do this, put ice in a plastic bag. Place a towel between your skin and the bag. Then, apply the ice and leave it on from 15-20 minutes each hour. After the third day, switch to moist heat packs.  Apply compression to the injured area with a splint or elastic bandage. Be careful not to wrap it too tightly. This may interfere with blood circulation or increase swelling.  Elevate the injured body part above the level of your heart as often as you can.  Only take over-the-counter or prescription medicines for pain, discomfort, or fever as directed by your  health care provider.  Warming up prior to exercise helps to prevent future muscle strains. SEEK MEDICAL CARE IF:   You have increasing pain or swelling in the injured area.  You have numbness, tingling, or a significant loss of strength in the injured area. MAKE SURE YOU:   Understand these instructions.  Will watch your condition.  Will get help right away if you are not doing well or get worse.   This information is not intended to replace advice given to you by your health care provider. Make sure you discuss any questions you have with your health care provider.   Document Released: 11/06/2005 Document Revised: 08/27/2013 Document Reviewed: 06/05/2013 Elsevier Interactive Patient Education 2016 [employer] Shoulder Pain The shoulder is the joint that connects your arms to your body. The bones that form the shoulder joint include the upper arm bone (humerus), the shoulder blade (scapula), and the collarbone (clavicle). The top of the humerus is shaped like a ball and fits into a rather flat socket on the scapula (glenoid cavity). A combination of muscles and strong, fibrous tissues that connect muscles to bones (tendons) support your shoulder joint and hold the ball in the socket. Small, fluid-filled sacs (bursae) are located in different areas of the joint. They act as cushions between the bones and the overlying soft tissues and help reduce friction between the gliding tendons and the bone as you move your arm. Your shoulder joint allows a wide range of motion in your arm. This range of motion allows you to  do things like scratch your back or throw a ball. However, this range of motion also makes your shoulder more prone to pain from overuse and injury. Causes of shoulder pain can originate from both injury and overuse and usually can be grouped in the following four categories:  Redness, swelling, and pain (inflammation) of the tendon (tendinitis) or the bursae  (bursitis).  Instability, such as a dislocation of the joint.  Inflammation of the joint (arthritis).  Broken bone (fracture). HOME CARE INSTRUCTIONS   Apply ice to the sore area.  Put ice in a plastic bag.  Place a towel between your skin and the bag.  Leave the ice on for 15-20 minutes, 3-4 times per day for the first 2 days, or as directed by your health care provider.  Stop using cold packs if they do not help with the pain.  If you have a shoulder sling or immobilizer, wear it as long as your caregiver instructs. Only remove it to shower or bathe. Move your arm as little as possible, but keep your hand moving to prevent swelling.  Squeeze a soft ball or foam pad as much as possible to help prevent swelling.  Only take over-the-counter or prescription medicines for pain, discomfort, or fever as directed by your caregiver. SEEK MEDICAL CARE IF:   Your shoulder pain increases, or new pain develops in your arm, hand, or fingers.  Your hand or fingers become cold and numb.  Your pain is not relieved with medicines. SEEK IMMEDIATE MEDICAL CARE IF:   Your arm, hand, or fingers are numb or tingling.  Your arm, hand, or fingers are significantly swollen or turn white or blue. MAKE SURE YOU:   Understand these instructions.  Will watch your condition.  Will get help right away if you are not doing well or get worse.   This information is not intended to replace advice given to you by your health care provider. Make sure you discuss any questions you have with your health care provider.   Document Released: 08/16/2005 Document Revised: 11/27/2014 Document Reviewed: 03/01/2015 Elsevier Interactive Patient Education 2016 [place] for Routine Care of Injuries Theroutine careofmanyinjuriesincludes rest, ice, compression, and elevation (RICE therapy). RICE therapy is often recommended for injuries to soft tissues, such as a muscle strain, ligament injuries,  bruises, and overuse injuries. It can also be used for some bony injuries. Using RICE therapy can help to relieve pain, lessen swelling, and enable your body to heal. Rest Rest is required to allow your body to heal. This usually involves reducing your normal activities and avoiding use of the injured part of your body. Generally, you can return to your normal activities when you are comfortable and have been given permission by your health care provider. Ice Icing your injury helps to keep the swelling down, and it lessens pain. Do not apply ice directly to your skin.  Put ice in a plastic bag.  Place a towel between your skin and the bag.  Leave the ice on for 20 minutes, 2-3 times a day. Do this for as long as you are directed by your health care provider. Compression Compression means putting pressure on the injured area. Compression helps to keep swelling down, gives support, and helps with discomfort. Compression may be done with an elastic bandage. If an elastic bandage has been applied, follow these general tips:  Remove and reapply the bandage every 3-4 hours or as directed by your health care provider.  Make sure the bandage is not wrapped too tightly, because this can cut off circulation. If part of your body beyond the bandage becomes blue, numb, cold, swollen, or more painful, your bandage is most likely too tight. If this occurs, remove your bandage and reapply it more loosely.  See your health care provider if the bandage seems to be making your problems worse rather than better. Elevation Elevation means keeping the injured area raised. This helps to lessen swelling and decrease pain. If possible, your injured area should be elevated at or above the level of your heart or the center of your chest. Agenda? You should seek medical care if:  Your pain and swelling continue.  Your symptoms are getting worse rather than improving. These symptoms may  indicate that further evaluation or further X-rays are needed. Sometimes, X-rays may not show a small broken bone (fracture) until a number of days later. Make a follow-up appointment with your health care provider. WHEN SHOULD I SEEK IMMEDIATE MEDICAL CARE? You should seek immediate medical care if:  You have sudden severe pain at or below the area of your injury.  You have redness or increased swelling around your injury.  You have tingling or numbness at or below the area of your injury that does not improve after you remove the elastic bandage.   This information is not intended to replace advice given to you by your health care provider. Make sure you discuss any questions you have with your health care provider.   Document Released: 02/18/2001 Document Revised: 07/28/2015 Document Reviewed: 10/14/2014 Elsevier Interactive Patient Education [employer].

## 2016-03-15 NOTE — ED Provider Notes (Signed)
CSN: [id number]     Arrival date & time 03/15/16  1539 History  By signing my name below, I, Derek Carroll, attest that this documentation has been prepared under the direction and in the presence of Derek Grana, PA-C. Electronically Signed: Randa Carroll, ED Scribe. 03/15/2016. 5:29 PM.    Chief Complaint  Patient presents with  . Shoulder Pain   The history is provided by the patient. No language interpreter was used.   HPI Comments: Derek Carroll is a 55 y.o. male who presents to the Emergency Department complaining of stabbing right shoulder pain onset 2 weeks prior. Pt rates the severity of his pain 3/10 currently and at his worse is 7/10, dull and achy when at rest, sharp and shooting with movement.  Pt states that he was lifting a heavy rim up onto his truck when he injured the shoulder. He states that he feels as if he pulled or strained the muscle. He states that lifting his arm above his hand and pouring motion makes the pain worse, especially when doing strenuous work or lifting weight, which he has been doing over the past two weeks. Pt denies any medications PTA. Pt denies numbness or tingling. Pt reports HX of similar injury 2 years ago which improved after 2 shots, one of which he believes was steroids. Pt states that he has also had bilateral hand and foot pain for the last year with intermittent swelling that causes pain.  He denies burning, tingling, numbness, redness. He is concerned that diabetes may be the cause of his year long pain and states he has not been to the doctor in several years. He denies CP, SOB, HA, urinary frequency, nocturia, polydipsia, fatigue, weightloss.  Past Medical History  Diagnosis Date  . Back pain    Past Surgical History  Procedure Laterality Date  . Leg surgery     No family history on file. Social History  Substance Use Topics  . Smoking status: Current Every Day Smoker    Types: Cigarettes  . Smokeless tobacco: None  . Alcohol Use:  No     Comment: Quit January 2016    Review of Systems  Respiratory: Negative for shortness of breath.   Cardiovascular: Negative for chest pain.  Musculoskeletal: Positive for myalgias and arthralgias.  Neurological: Negative for headaches.  All other systems reviewed and are negative.    Allergies  Review of patient's allergies indicates no known allergies.  Home Medications   Prior to Admission medications   Medication Sig Start Date End Date Taking? Authorizing Provider  cyclobenzaprine (FLEXERIL) 10 MG tablet Take 1 tablet (10 mg total) by mouth 2 (two) times daily as needed for muscle spasms. 03/15/16   Derek Grana, PA-C  HYDROcodone-acetaminophen (NORCO/VICODIN) 5-325 MG per tablet Take 1 tablet by mouth every 4 (four) hours as needed. 06/11/15   Larene Pickett, PA-C  ibuprofen (ADVIL,MOTRIN) 800 MG tablet Take 1 tablet (800 mg total) by mouth 3 (three) times daily with meals. 03/15/16   Derek Grana, PA-C   BP 164/103 mmHg  Pulse 79  Temp(Src) 97.5 F (36.4 C) (Oral)  Resp 18  SpO2 95%  Physical Exam  Constitutional: He is oriented to person, place, and time. He appears well-developed and well-nourished. No distress.  HENT:  Head: Normocephalic and atraumatic.  Right Ear: External ear normal.  Left Ear: External ear normal.  Nose: Nose normal.  Mouth/Throat: Oropharynx is clear and moist. No oropharyngeal exudate.  Eyes: Conjunctivae and EOM are normal. Pupils  are equal, round, and reactive to light. Right eye exhibits no discharge. Left eye exhibits no discharge. No scleral icterus.  Neck: Normal range of motion. Neck supple. No JVD present. No tracheal deviation present.  Cardiovascular: Normal rate, regular rhythm, normal heart sounds and intact distal pulses.   Pulmonary/Chest: Effort normal and breath sounds normal. No stridor. No respiratory distress.  Musculoskeletal: Normal range of motion. He exhibits no edema.       Right shoulder: He exhibits decreased  strength. He exhibits normal range of motion, no tenderness, no bony tenderness, no swelling, no effusion, no crepitus, no deformity, no laceration, no pain, no spasm and normal pulse.       Cervical back: Normal.       Thoracic back: Normal.  Right shoulder normal passive ROM, flexion 4/5 strength against resistance, secondary to pain, no bony tenderness, no ttp, no edema, no erythema Radial pulse 2+ bilaterally, symmetrical grip strength Negative apley's, empty can test, drop test  Lymphadenopathy:    He has no cervical adenopathy.  Neurological: He is alert and oriented to person, place, and time. He exhibits normal muscle tone. Coordination normal.  Skin: Skin is warm and dry. No rash noted. He is not diaphoretic. No erythema. No pallor.  Psychiatric: He has a normal mood and affect. His behavior is normal. Judgment and thought content normal.  Nursing note and vitals reviewed.   ED Course  Procedures (including critical care time) DIAGNOSTIC STUDIES: Oxygen Saturation is 95% on RA, adequate by my interpretation.    COORDINATION OF CARE: 5:25 PM-Discussed treatment plan with pt at bedside and pt agreed to plan.     Labs Review Labs Reviewed  CBG MONITORING, ED - Abnormal; Notable for the following:    Glucose-Capillary 105 (*)    All other components within normal limits    Imaging Review Dg Shoulder Right  03/15/2016  CLINICAL DATA:  Lifting injury 2 weeks ago with shoulder pain, initial encounter EXAM: RIGHT SHOULDER - 2+ VIEW COMPARISON:  08/28/2013 FINDINGS: There is no evidence of fracture or dislocation. There is no evidence of arthropathy or other focal bone abnormality. Soft tissues are unremarkable. IMPRESSION: No acute abnormality noted. Electronically Signed   By: Inez Catalina M.D.   On: 03/15/2016 16:45   I have personally reviewed and evaluated these images as part of my medical decision-making.   EKG Interpretation None      MDM   Right shoulder pain  after lifting/strain injury 2 weeks ago.   PE shows no instability, tenderness, or deformity of acromioclavicular and sternoclavicular joints, the cervical spine, glenohumeral joint, coracoid process, acromion, or scapula. Good shoulder strength during empty can test. Good ROM during apley scratch test. No signs of impingement, no shoulder instability.  Xray negative. Pt given toradol and decadron shot, encouraged RICE tx.  Pt was mildly hypertensive, likely secondary to pain.  Asymptomatic of red flags concerning for hypertensive urgency, no HA, rash, CP, SOB.  Given acute pain, will not initiate meds at this time.  Pt was concerned that he may have diabetes, however, history is not suggestive of DM.  CBG 105.  Encouraged to follow up with PCP regarding his many chronic concerns.   He was d/c in good condition.   Final diagnoses:  Right shoulder strain, initial encounter   I personally performed the services described in this documentation, which was scribed in my presence. The recorded information has been reviewed and is accurate.  Derek Grana, PA-C 03/15/16 1855  Quintella Reichert, MD 03/17/16 [id number]

## 2016-03-15 NOTE — ED Notes (Signed)
Pt states that 2 Fridays ago you were lifting a big brake drum and felt something pull in his right shoulder and states continues to hurt consistently.  Pt had one of his shoulders in 2015 where he had to get steroid shot and antibiotic.  Pt has pain with lifting and makes pouring things hard.  No chest pain

## 2016-07-12 ENCOUNTER — Ambulatory Visit (INDEPENDENT_AMBULATORY_CARE_PROVIDER_SITE_OTHER): Payer: Medicare Other | Admitting: Physician Assistant

## 2016-07-12 VITALS — BP 136/80 | HR 80 | Temp 98.2°F | Resp 17 | Ht 69.5 in | Wt 227.0 lb

## 2016-07-12 DIAGNOSIS — I10 Essential (primary) hypertension: Secondary | ICD-10-CM | POA: Diagnosis not present

## 2016-07-12 MED ORDER — LISINOPRIL-HYDROCHLOROTHIAZIDE 10-12.5 MG PO TABS: 1.0000 | ORAL_TABLET | Freq: Every day | ORAL | 5 refills | Status: DC

## 2016-07-12 NOTE — Progress Notes (Signed)
[facility] [address] [place] [address] [phone]  By signing my name below I, Derek Carroll, attest that this documentation has been prepared under the direction and in the presence of Ivar Drape PA. Electonically Signed. Derek Carroll, Scribe 07/12/2016 at 5:50 PM  Date:  07/12/2016   Name:  Derek Carroll   DOB:  Mar 19, 1961   MRN:  [id number]  PCP:  Default, Provider, MD    History of Present Illness: Chief Complaint  Patient presents with   Hypertension    Derek Carroll is a 55 y.o. male patient who presents to [facility] for follow up regarding hypertension. Pt recently graduated college. He works as  a Automotive engineer. He is required to have a physical, which he done at another facility. At that time his BP was 180/112 and rechecked 5 minutes later was 180/111. He followed up at urgent care at [facility] and was prescribed lisinopril HCTZ. He had a f/u appointment with them today but states his appointment got cancelled. Pt states he has been doing well on medication prescribed. Pt reports being told he's had HTN in the past.  Pt quit drinking about 1 year ago. No change in diet. Pt states he has been exercising and losing weight.  He smokes .5 -1 pack a day. He snores with exhausted but has never been told he stops breathing.   He denies CP, leg swelling,.  There are no active problems to display for this patient.   Past Medical History:  Diagnosis Date   Back pain     Past Surgical History:  Procedure Laterality Date   LEG SURGERY      Social History  Substance Use Topics   Smoking status: Current Every Day Smoker    Packs/day: 0.75    Years: 36.00    Types: Cigarettes   Smokeless tobacco: Never Used   Alcohol use No     Comment: Quit January 2016    No family history on file.  No Known Allergies  Medication list has been reviewed and updated.  Current Outpatient Prescriptions on File Prior to Visit  Medication  Sig Dispense Refill   cyclobenzaprine (FLEXERIL) 10 MG tablet Take 1 tablet (10 mg total) by mouth 2 (two) times daily as needed for muscle spasms. 20 tablet 0   HYDROcodone-acetaminophen (NORCO/VICODIN) 5-325 MG per tablet Take 1 tablet by mouth every 4 (four) hours as needed. 12 tablet 0   ibuprofen (ADVIL,MOTRIN) 800 MG tablet Take 1 tablet (800 mg total) by mouth 3 (three) times daily with meals. 21 tablet 0   No current facility-administered medications on file prior to visit.     Review of Systems  Constitutional: Negative for malaise/fatigue.  Eyes: Negative for blurred vision and double vision.  Respiratory: Negative for shortness of breath.   Cardiovascular: Negative for chest pain, palpitations and leg swelling.  Neurological: Negative for headaches.   ROS unremarkable unless otherwise specified.  Physical Examination: BP 136/80 (BP Location: Right Arm, Patient Position: Sitting, Cuff Size: Normal)    Pulse 80    Temp 98.2 F (36.8 C) (Oral)    Resp 17    Ht 5' 9.5" (1.765 m)    Wt 227 lb (103 kg)    SpO2 98%    BMI 33.04 kg/m  Ideal Body Weight: @FLOWAMB [id number]  Physical Exam  Constitutional: He is oriented to person, place, and time. He appears well-developed and well-nourished. No distress.  HENT:  Head: Normocephalic and  atraumatic.  Eyes: Conjunctivae and EOM are normal. Pupils are equal, round, and reactive to light.  Cardiovascular: Normal rate.   Pulmonary/Chest: Effort normal. No respiratory distress.  Neurological: He is alert and oriented to person, place, and time.  Skin: Skin is warm and dry. He is not diaphoretic.  Psychiatric: He has a normal mood and affect. His behavior is normal.   Assessment and Plan: [name] is a 55 y.o. male who is here today for bp recheck. Will refill for 6 months.  Advised to return sooner (3-6 months) for the annual physical exam.  Also advised heart pumping activity. Advised the clinical team to obtain his  records from [place], where we can get his lab work.  Essential hypertension - Plan: lisinopril-hydrochlorothiazide (PRINZIDE,ZESTORETIC) 10-12.5 MG tablet  Ivar Drape, PA-C [facility] 07/12/2016 5:50 PM

## 2016-07-12 NOTE — Patient Instructions (Addendum)
Please return in the next 3 months to 6 months for an annual physical exam. I will contact you if I need more blood work from you.   Sounds like you are doing a wonderful job in taking medication.  All we need is to do the healthy foods.Marland Kitchen. and soon, we can start incorporating exercise, 4 times per week of 30 minutes of constant movement (heart pumping such as walking, running, swimming, etc.)  Hypertension Hypertension, commonly called high blood pressure, is when the force of blood pumping through your arteries is too strong. Your arteries are the blood vessels that carry blood from your heart throughout your body. A blood pressure reading consists of a higher number over a lower number, such as 110/72. The higher number (systolic) is the pressure inside your arteries when your heart pumps. The lower number (diastolic) is the pressure inside your arteries when your heart relaxes. Ideally you want your blood pressure below 120/80. Hypertension forces your heart to work harder to pump blood. Your arteries may become narrow or stiff. Having untreated or uncontrolled hypertension can cause heart attack, stroke, kidney disease, and other problems. RISK FACTORS Some risk factors for high blood pressure are controllable. Others are not.  Risk factors you cannot control include:   Race. You may be at higher risk if you are African American.  Age. Risk increases with age.  Gender. Men are at higher risk than women before age 56 years. After age 56, women are at higher risk than men. Risk factors you can control include:  Not getting enough exercise or physical activity.  Being overweight.  Getting too much fat, sugar, calories, or salt in your diet.  Drinking too much alcohol. SIGNS AND SYMPTOMS Hypertension does not usually cause signs or symptoms. Extremely high blood pressure (hypertensive crisis) may cause headache, anxiety, shortness of breath, and nosebleed. DIAGNOSIS To check if you have  hypertension, your health care provider will measure your blood pressure while you are seated, with your arm held at the level of your heart. It should be measured at least twice using the same arm. Certain conditions can cause a difference in blood pressure between your right and left arms. A blood pressure reading that is higher than normal on one occasion does not mean that you need treatment. If it is not clear whether you have high blood pressure, you may be asked to return on a different day to have your blood pressure checked again. Or, you may be asked to monitor your blood pressure at home for 1 or more weeks. TREATMENT Treating high blood pressure includes making lifestyle changes and possibly taking medicine. Living a healthy lifestyle can help lower high blood pressure. You may need to change some of your habits. Lifestyle changes may include:  Following the DASH diet. This diet is high in fruits, vegetables, and whole grains. It is low in salt, red meat, and added sugars.  Keep your sodium intake below 2,300 mg per day.  Getting at least 30-45 minutes of aerobic exercise at least 4 times per week.  Losing weight if necessary.  Not smoking.  Limiting alcoholic beverages.  Learning ways to reduce stress. Your health care provider may prescribe medicine if lifestyle changes are not enough to get your blood pressure under control, and if one of the following is true:  You are 7218-56 years of age and your systolic blood pressure is above 140.  You are 56 years of age or older, and your systolic blood  pressure is above 150.  Your diastolic blood pressure is above 90.  You have diabetes, and your systolic blood pressure is over [id number] or your diastolic blood pressure is over 90.  You have kidney disease and your blood pressure is above 140/90.  You have heart disease and your blood pressure is above 140/90. Your personal target blood pressure may vary depending on your medical  conditions, your age, and other factors. HOME CARE INSTRUCTIONS  Have your blood pressure rechecked as directed by your health care provider.   Take medicines only as directed by your health care provider. Follow the directions carefully. Blood pressure medicines must be taken as prescribed. The medicine does not work as well when you skip doses. Skipping doses also puts you at risk for problems.  Do not smoke.   Monitor your blood pressure at home as directed by your health care provider. SEEK MEDICAL CARE IF:   You think you are having a reaction to medicines taken.  You have recurrent headaches or feel dizzy.  You have swelling in your ankles.  You have trouble with your vision. SEEK IMMEDIATE MEDICAL CARE IF:  You develop a severe headache or confusion.  You have unusual weakness, numbness, or feel faint.  You have severe chest or abdominal pain.  You vomit repeatedly.  You have trouble breathing. MAKE SURE YOU:   Understand these instructions.  Will watch your condition.  Will get help right away if you are not doing well or get worse.   This information is not intended to replace advice given to you by your health care provider. Make sure you discuss any questions you have with your health care provider.   Document Released: 11/06/2005 Document Revised: 03/23/2015 Document Reviewed: 08/29/2013 Elsevier Interactive Patient Education 2016 [place] DASH stands for "Dietary Approaches to Stop Hypertension." The DASH eating plan is a healthy eating plan that has been shown to reduce high blood pressure (hypertension). Additional health benefits may include reducing the risk of type 2 diabetes mellitus, heart disease, and stroke. The DASH eating plan may also help with weight loss. WHAT DO I NEED TO KNOW ABOUT THE DASH EATING PLAN? For the DASH eating plan, you will follow these general guidelines:  Choose foods with a percent daily value  for sodium of less than 5% (as listed on the food label).  Use salt-free seasonings or herbs instead of table salt or sea salt.  Check with your health care provider or pharmacist before using salt substitutes.  Eat lower-sodium products, often labeled as "lower sodium" or "no salt added."  Eat fresh foods.  Eat more vegetables, fruits, and low-fat dairy products.  Choose whole grains. Look for the word "whole" as the first word in the ingredient list.  Choose fish and skinless chicken or [place] more often than red meat. Limit fish, poultry, and meat to 6 oz (170 g) each day.  Limit sweets, desserts, sugars, and sugary drinks.  Choose heart-healthy fats.  Limit cheese to 1 oz (28 g) per day.  Eat more home-cooked food and less restaurant, buffet, and fast food.  Limit fried foods.  Cook foods using methods other than frying.  Limit canned vegetables. If you do use them, rinse them well to decrease the sodium.  When eating at a restaurant, ask that your food be prepared with less salt, or no salt if possible. WHAT FOODS CAN I EAT? Seek help from a dietitian for individual calorie needs. Grains  Whole grain or whole wheat bread. Brown rice. Whole grain or whole wheat pasta. Quinoa, bulgur, and whole grain cereals. Low-sodium cereals. Corn or whole wheat flour tortillas. Whole grain cornbread. Whole grain crackers. Low-sodium crackers. Vegetables Fresh or frozen vegetables (raw, steamed, roasted, or grilled). Low-sodium or reduced-sodium tomato and vegetable juices. Low-sodium or reduced-sodium tomato sauce and paste. Low-sodium or reduced-sodium canned vegetables.  Fruits All fresh, canned (in natural juice), or frozen fruits. Meat and Other Protein Products Ground beef (85% or leaner), grass-fed beef, or beef trimmed of fat. Skinless chicken or [place]. Ground chicken or [place]. Pork trimmed of fat. All fish and seafood. Eggs. Dried beans, peas, or lentils. Unsalted nuts and  seeds. Unsalted canned beans. Dairy Low-fat dairy products, such as skim or 1% milk, 2% or reduced-fat cheeses, low-fat ricotta or cottage cheese, or plain low-fat yogurt. Low-sodium or reduced-sodium cheeses. Fats and Oils Tub margarines without trans fats. Light or reduced-fat mayonnaise and salad dressings (reduced sodium). Avocado. Safflower, olive, or canola oils. Natural peanut or almond butter. Other Unsalted popcorn and pretzels. The items listed above may not be a complete list of recommended foods or beverages. Contact your dietitian for more options. WHAT FOODS ARE NOT RECOMMENDED? Grains White bread. White pasta. White rice. Refined cornbread. Bagels and croissants. Crackers that contain trans fat. Vegetables Creamed or fried vegetables. Vegetables in a cheese sauce. Regular canned vegetables. Regular canned tomato sauce and paste. Regular tomato and vegetable juices. Fruits Dried fruits. Canned fruit in light or heavy syrup. Fruit juice. Meat and Other Protein Products Fatty cuts of meat. Ribs, chicken wings, bacon, sausage, bologna, salami, chitterlings, fatback, hot dogs, bratwurst, and packaged luncheon meats. Salted nuts and seeds. Canned beans with salt. Dairy Whole or 2% milk, cream, half-and-half, and cream cheese. Whole-fat or sweetened yogurt. Full-fat cheeses or blue cheese. Nondairy creamers and whipped toppings. Processed cheese, cheese spreads, or cheese curds. Condiments Onion and garlic salt, seasoned salt, table salt, and sea salt. Canned and packaged gravies. Worcestershire sauce. Tartar sauce. Barbecue sauce. Teriyaki sauce. Soy sauce, including reduced sodium. Steak sauce. Fish sauce. Oyster sauce. Cocktail sauce. Horseradish. Ketchup and mustard. Meat flavorings and tenderizers. Bouillon cubes. Hot sauce. Tabasco sauce. Marinades. Taco seasonings. Relishes. Fats and Oils Butter, stick margarine, lard, shortening, ghee, and bacon fat. Coconut, palm kernel, or  palm oils. Regular salad dressings. Other Pickles and olives. Salted popcorn and pretzels. The items listed above may not be a complete list of foods and beverages to avoid. Contact your dietitian for more information. WHERE CAN I FIND MORE INFORMATION? National Heart, Lung, and Blood Institute: travelstabloid.com   This information is not intended to replace advice given to you by your health care provider. Make sure you discuss any questions you have with your health care provider.   Document Released: 10/26/2011 Document Revised: 11/27/2014 Document Reviewed: 09/10/2013 Elsevier Interactive Patient Education 2016 [employer].     IF you received an x-ray today, you will receive an invoice from [facility]. Please contact [facility] at [phone] with questions or concerns regarding your invoice.   IF you received labwork today, you will receive an invoice from [employer]. Please contact [facility] at [phone] with questions or concerns regarding your invoice.   Our billing staff will not be able to assist you with questions regarding bills from these companies.  You will be contacted with the lab results as soon as they are available. The fastest way to get your results is to activate your  My Chart account. Instructions are located on the last page of this paperwork. If you have not heard from [place] regarding the results in 2 weeks, please contact this office.

## 2016-07-16 ENCOUNTER — Telehealth: Payer: Self-pay | Admitting: Physician Assistant

## 2016-07-16 NOTE — Telephone Encounter (Signed)
Can you please retrieve [facility] lab work and visit note on this patient please.  He recently was seen here in the last 2 weeks.

## 2016-08-02 ENCOUNTER — Ambulatory Visit (INDEPENDENT_AMBULATORY_CARE_PROVIDER_SITE_OTHER): Payer: Medicare Other | Admitting: Physician Assistant

## 2016-08-02 VITALS — BP 134/82 | HR 76 | Temp 97.4°F | Resp 18 | Ht 69.5 in | Wt 226.0 lb

## 2016-08-02 DIAGNOSIS — M549 Dorsalgia, unspecified: Secondary | ICD-10-CM | POA: Diagnosis not present

## 2016-08-02 DIAGNOSIS — M25511 Pain in right shoulder: Secondary | ICD-10-CM | POA: Diagnosis not present

## 2016-08-02 DIAGNOSIS — M2550 Pain in unspecified joint: Secondary | ICD-10-CM | POA: Diagnosis not present

## 2016-08-02 DIAGNOSIS — M545 Low back pain: Secondary | ICD-10-CM

## 2016-08-02 DIAGNOSIS — G8929 Other chronic pain: Secondary | ICD-10-CM | POA: Insufficient documentation

## 2016-08-02 MED ORDER — MELOXICAM 15 MG PO TABS: 7.5000 mg | ORAL_TABLET | Freq: Every day | ORAL | 1 refills | Status: DC

## 2016-08-02 MED ORDER — TRAMADOL HCL 50 MG PO TABS: 25.0000 mg | ORAL_TABLET | Freq: Three times a day (TID) | ORAL | 1 refills | Status: DC | PRN

## 2016-08-02 NOTE — Patient Instructions (Addendum)
For back, shoulder and feet pain take Tylenol (acetamenophen) 1000 mg every eight hours as needed.  This is the safest and cheapest medication.   If your pain persist through Tylenol, then add Meloxicam (mobic), preferably in the morning.   If either of the above are not helping enough with pain then take 1 tramadol every 6-8 hours and I will be happy to manage this medication for you.    If you need hydrocodone to manage your pain I will refer you to a pain doctor.     IF you received an x-ray today, you will receive an invoice from [facility]. Please contact [facility] at [phone] with questions or concerns regarding your invoice.   IF you received labwork today, you will receive an invoice from [employer]. Please contact [facility] at [phone] with questions or concerns regarding your invoice.   Our billing staff will not be able to assist you with questions regarding bills from these companies.  You will be contacted with the lab results as soon as they are available. The fastest way to get your results is to activate your My Chart account. Instructions are located on the last page of this paperwork. If you have not heard from [place] regarding the results in 2 weeks, please contact this office.

## 2016-08-02 NOTE — Progress Notes (Signed)
08/02/2016 2:39 PM   DOB: January 24, 1961 / MRN: [id number]  SUBJECTIVE:  Derek Carroll is a 55 y.o. male presenting for refills of pain medication.  Relates a history of low back pain secondary to degenerative disk disease and uses these medications to control that.  Also has a history of right shoulder pain that required surgery and after surgery he received hydrocodone gave him good relief of pain and he has simply been getting this refilled at various places.  He would like to be educated on other medications, aside from narcotics, to help him manage his pain and he would like to avoid a referral to chronic pain if possible. He reports that he does not need medication daily, and it mostly depends on what type of work he has to do, which varies in physicality.   He has No Known Allergies.   He  has a past medical history of Back pain.    He  reports that he has been smoking Cigarettes.  He has a 27.00 pack-year smoking history. He has never used smokeless tobacco. He reports that he does not drink alcohol or use drugs. He  reports that he does not engage in sexual activity. The patient  has a past surgical history that includes Leg Surgery.  His family history is not on file.  Review of Systems  Constitutional: Negative for chills and fever.  Musculoskeletal: Positive for back pain, joint pain and myalgias. Negative for falls and neck pain.  Skin: Negative for itching and rash.  Neurological: Negative for dizziness and headaches.    The problem list and medications were reviewed and updated by myself where necessary and exist elsewhere in the encounter.   OBJECTIVE:  BP 134/82 (BP Location: Right Arm, Patient Position: Sitting, Cuff Size: Large)   Pulse 76   Temp 97.4 F (36.3 C) (Oral)   Resp 18   Ht 5' 9.5" (1.765 m)   Wt 226 lb (102.5 kg)   SpO2 95%   BMI 32.90 kg/m   Physical Exam  Constitutional: He is oriented to person, place, and time. Vital signs are normal.    Cardiovascular: Normal rate and regular rhythm.   Pulmonary/Chest: Effort normal and breath sounds normal.  Musculoskeletal: Normal range of motion. He exhibits no edema, tenderness or deformity.  Neurological: He is alert and oriented to person, place, and time. He displays normal reflexes. No cranial nerve deficit. He exhibits normal muscle tone. Coordination normal.  Skin: Skin is warm and dry.    No results found for this or any previous visit (from the past 72 hour(s)).  No results found.  ASSESSMENT AND PLAN  Charith was seen today for medication refill.  Diagnoses and all orders for this visit:  Midline low back pain, with sciatica presence unspecified: Patient amenable to other options rather than just opioids. See AVS.  If this plan fails then I will get him a referral to pain.  -     meloxicam (MOBIC) 15 MG tablet; Take 0.5-1 tablets (7.5-15 mg total) by mouth daily. -     traMADol (ULTRAM) 50 MG tablet; Take 0.5-1 tablets (25-50 mg total) by mouth every 8 (eight) hours as needed.  Pain in joint of right shoulder -     meloxicam (MOBIC) 15 MG tablet; Take 0.5-1 tablets (7.5-15 mg total) by mouth daily. -     traMADol (ULTRAM) 50 MG tablet; Take 0.5-1 tablets (25-50 mg total) by mouth every 8 (eight) hours as needed.  The patient is advised to call or return to clinic if he does not see an improvement in symptoms, or to seek the care of the closest emergency department if he worsens with the above plan.   Philis Fendt, MHS, PA-C [facility] 08/02/2016 2:39 PM

## 2016-12-25 ENCOUNTER — Telehealth: Payer: Self-pay

## 2016-12-26 ENCOUNTER — Ambulatory Visit (INDEPENDENT_AMBULATORY_CARE_PROVIDER_SITE_OTHER): Payer: Medicare Other

## 2016-12-26 ENCOUNTER — Ambulatory Visit (INDEPENDENT_AMBULATORY_CARE_PROVIDER_SITE_OTHER): Payer: Medicare Other | Admitting: Physician Assistant

## 2016-12-26 VITALS — BP 180/104 | HR 78 | Temp 98.0°F | Resp 17 | Ht 69.5 in | Wt 228.0 lb

## 2016-12-26 DIAGNOSIS — R739 Hyperglycemia, unspecified: Secondary | ICD-10-CM

## 2016-12-26 DIAGNOSIS — F17200 Nicotine dependence, unspecified, uncomplicated: Secondary | ICD-10-CM | POA: Diagnosis not present

## 2016-12-26 DIAGNOSIS — I10 Essential (primary) hypertension: Secondary | ICD-10-CM

## 2016-12-26 DIAGNOSIS — Z029 Encounter for administrative examinations, unspecified: Secondary | ICD-10-CM | POA: Diagnosis not present

## 2016-12-26 MED ORDER — AMLODIPINE BESYLATE 5 MG PO TABS: 5.0000 mg | ORAL_TABLET | Freq: Every day | ORAL | 1 refills | Status: DC

## 2016-12-26 NOTE — Patient Instructions (Signed)
     IF you received an x-ray today, you will receive an invoice from [facility]. Please contact [facility] at [phone] with questions or concerns regarding your invoice.   IF you received labwork today, you will receive an invoice from [facility]. Please contact [facility] at [phone] with questions or concerns regarding your invoice.   Our billing staff will not be able to assist you with questions regarding bills from these companies.  You will be contacted with the lab results as soon as they are available. The fastest way to get your results is to activate your My Chart account. Instructions are located on the last page of this paperwork. If you have not heard from us regarding the results in 2 weeks, please contact this office.     

## 2016-12-26 NOTE — Progress Notes (Signed)
12/26/2016 3:46 PM   DOB: May 17, 1961 / MRN: [id number]  SUBJECTIVE:  Derek Carroll is a 56 y.o. male presenting for work restrictions.  He is on disability.  He works at [employer] and was told that he would not have to lift anything over 25 lbs.  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    He tells me he has known about his HTN for about 2 years, and started acting on it with medication last year.  He denies any symptoms today and tells me he feels "fine."  He will often check this in the morning with a wrist cuff and it will read 160-170/100.  It is unclear why he has not come back for management.  He is a smoker.  He is not getting physical exams.  He is will to come back for this.   He has No Known Allergies.   He  has a past medical history of Back pain.    He  reports that he has been smoking Cigarettes.  He has a 27.00 pack-year smoking history. He has never used smokeless tobacco. He reports that he does not drink alcohol or use drugs. He  reports that he does not engage in sexual activity. The patient  has a past surgical history that includes Leg Surgery.  His family history is not on file.  Review of Systems  Eyes: Negative for blurred vision, double vision, photophobia, pain, discharge and redness.  Respiratory: Negative for cough and shortness of breath.   Cardiovascular: Negative for chest pain and leg swelling.  Musculoskeletal: Negative for myalgias.  Neurological: Negative for headaches.    The problem list and medications were reviewed and updated by myself where necessary and exist elsewhere in the encounter.   OBJECTIVE:  BP (!) 180/104 (BP Location: Right Arm, Patient Position: Sitting, Cuff Size: Normal)   Pulse 78   Temp 98 F (36.7 C) (Oral)   Resp 17   Ht 5' 9.5" (1.765 m)   Wt 228 lb (103.4 kg)   SpO2 95%   BMI 33.19 kg/m   Physical Exam  Constitutional: He is oriented  to person, place, and time.  Cardiovascular: Normal rate, regular rhythm and normal heart sounds.   Pulmonary/Chest: Effort normal and breath sounds normal.  Musculoskeletal: Normal range of motion.       Right ankle: Normal.       Left ankle: Normal.       Right lower leg: He exhibits no tenderness.       Left lower leg: He exhibits no tenderness.       Legs: Neurological: He is alert and oriented to person, place, and time. No cranial nerve deficit.   BP Readings from Last 3 Encounters:  12/26/16 (!) 180/104  08/02/16 134/82  07/12/16 136/80   No results found for this or any previous visit (from the past 72 hour(s)).  Dg Chest 2 View  Result Date: 12/26/2016 CLINICAL DATA:  Chronic uncontrolled hypertension EXAM: CHEST  2 VIEW COMPARISON:  None. FINDINGS: Heart and mediastinal contours are within normal limits. No focal opacities or effusions. No acute bony abnormality. IMPRESSION: No active cardiopulmonary disease. Electronically Signed   By: Rolm Baptise M.D.   On: 12/26/2016 15:42    ASSESSMENT AND PLAN:  Derek Carroll was seen today for restrictions for work needed.  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  I have written him for no lifting greater than 25 lbs and to work on Recruitment consultant duty.    Essential hypertension: New problem to me.  [name] today. His work up is incomplete. Rads are normal.  Previous EKG reviewed a -     TSH -     CBC -     Basic metabolic panel -     DG Chest 2 View; Future -     Brain natriuretic peptide -     Microalbumin, urine -     amLODipine (NORVASC) 5 MG tablet; Take 1 tablet (5 mg total) by mouth daily.  Smoker: He is not ready to quit today.  Will reassess at future visits.     The patient is advised to call or return to clinic if he does not see an improvement in symptoms, or to seek  the care of the closest emergency department if he worsens with the above plan.   Philis Fendt, MHS, PA-C [facility] 12/26/2016 3:46 PM

## 2016-12-27 LAB — BASIC METABOLIC PANEL
BUN / CREAT RATIO: 16 (ref 9–20)
BUN: 14 mg/dL (ref 6–24)
CALCIUM: 9.3 mg/dL (ref 8.7–10.2)
CHLORIDE: 102 mmol/L (ref 96–106)
CO2: 21 mmol/L (ref 18–29)
Creatinine, Ser: 0.9 mg/dL (ref 0.76–1.27)
GFR calc non Af Amer: 95 mL/min/{1.73_m2} (ref >59)
GFR, EST AFRICAN AMERICAN: 110 mL/min/{1.73_m2} (ref >59)
Glucose: 169 mg/dL — ABNORMAL HIGH (ref 65–99)
POTASSIUM: 4.2 mmol/L (ref 3.5–5.2)
SODIUM: 143 mmol/L (ref 134–144)

## 2016-12-27 LAB — CBC
Hematocrit: 49.3 % (ref 37.5–51.0)
Hemoglobin: 15.9 g/dL (ref 13.0–17.7)
MCH: 32.1 pg (ref 26.6–33.0)
MCHC: 32.3 g/dL (ref 31.5–35.7)
MCV: 100 fL — AB (ref 79–97)
PLATELETS: 286 10*3/uL (ref 150–379)
RBC: 4.95 x10E6/uL (ref 4.14–5.80)
RDW: 13 % (ref 12.3–15.4)
WBC: 10.5 10*3/uL (ref 3.4–10.8)

## 2016-12-27 LAB — MICROALBUMIN, URINE: Microalbumin, Urine: 34.3 ug/mL

## 2016-12-27 LAB — BRAIN NATRIURETIC PEPTIDE: BNP: 12.9 pg/mL (ref 0.0–100.0)

## 2016-12-27 LAB — TSH: TSH: 1.47 u[IU]/mL (ref 0.450–4.500)

## 2016-12-27 NOTE — Addendum Note (Signed)
Addended by: Gari Crown D on: 12/27/2016 11:57 AM   Modules accepted: Orders

## 2016-12-29 LAB — HEMOGLOBIN A1C
ESTIMATED AVERAGE GLUCOSE: 103 mg/dL
HEMOGLOBIN A1C: 5.2 % (ref 4.8–5.6)

## 2016-12-29 LAB — SPECIMEN STATUS REPORT

## 2017-01-28 ENCOUNTER — Other Ambulatory Visit: Payer: Self-pay | Admitting: Physician Assistant

## 2017-01-28 DIAGNOSIS — I10 Essential (primary) hypertension: Secondary | ICD-10-CM

## 2017-01-31 ENCOUNTER — Other Ambulatory Visit: Payer: Self-pay | Admitting: Physician Assistant

## 2017-01-31 DIAGNOSIS — I10 Essential (primary) hypertension: Secondary | ICD-10-CM

## 2017-01-31 NOTE — Telephone Encounter (Signed)
Pt has appt 02/05/17

## 2017-02-05 ENCOUNTER — Encounter: Payer: Self-pay | Admitting: Physician Assistant

## 2017-02-05 ENCOUNTER — Ambulatory Visit (INDEPENDENT_AMBULATORY_CARE_PROVIDER_SITE_OTHER): Payer: Medicare Other | Admitting: Physician Assistant

## 2017-02-05 VITALS — BP 133/85 | HR 76 | Temp 98.5°F | Resp 16 | Ht 69.0 in | Wt 225.0 lb

## 2017-02-05 DIAGNOSIS — E781 Pure hyperglyceridemia: Secondary | ICD-10-CM | POA: Diagnosis not present

## 2017-02-05 DIAGNOSIS — I10 Essential (primary) hypertension: Secondary | ICD-10-CM | POA: Diagnosis not present

## 2017-02-05 DIAGNOSIS — Z1159 Encounter for screening for other viral diseases: Secondary | ICD-10-CM

## 2017-02-05 DIAGNOSIS — Z0000 Encounter for general adult medical examination without abnormal findings: Secondary | ICD-10-CM

## 2017-02-05 DIAGNOSIS — Z13220 Encounter for screening for lipoid disorders: Secondary | ICD-10-CM

## 2017-02-05 DIAGNOSIS — Z122 Encounter for screening for malignant neoplasm of respiratory organs: Secondary | ICD-10-CM

## 2017-02-05 DIAGNOSIS — Z23 Encounter for immunization: Secondary | ICD-10-CM

## 2017-02-05 DIAGNOSIS — Z1211 Encounter for screening for malignant neoplasm of colon: Secondary | ICD-10-CM | POA: Diagnosis not present

## 2017-02-05 DIAGNOSIS — Z9189 Other specified personal risk factors, not elsewhere classified: Secondary | ICD-10-CM

## 2017-02-05 DIAGNOSIS — Z114 Encounter for screening for human immunodeficiency virus [HIV]: Secondary | ICD-10-CM | POA: Diagnosis not present

## 2017-02-05 MED ORDER — LISINOPRIL-HYDROCHLOROTHIAZIDE 10-12.5 MG PO TABS: 1.0000 | ORAL_TABLET | Freq: Every day | ORAL | 3 refills | Status: DC

## 2017-02-05 MED ORDER — AMLODIPINE BESYLATE 5 MG PO TABS: 5.0000 mg | ORAL_TABLET | Freq: Every day | ORAL | 3 refills | Status: DC

## 2017-02-05 NOTE — Patient Instructions (Signed)
     IF you received an x-ray today, you will receive an invoice from [facility]. Please contact [facility] at [phone] with questions or concerns regarding your invoice.   IF you received labwork today, you will receive an invoice from [facility]. Please contact [facility] at [phone] with questions or concerns regarding your invoice.   Our billing staff will not be able to assist you with questions regarding bills from these companies.  You will be contacted with the lab results as soon as they are available. The fastest way to get your results is to activate your My Chart account. Instructions are located on the last page of this paperwork. If you have not heard from us regarding the results in 2 weeks, please contact this office.     

## 2017-02-05 NOTE — Progress Notes (Signed)
02/09/2017 11:07 AM   DOB: September 28, 1961 / MRN: [id number]  SUBJECTIVE:  Derek Carroll is a 56 y.o. male presenting for annual exam.  He feels well.  He does not exercise formally.  Does not know the status of his lipids.    Patient reports he has been taking his BP medication without fail and feels well.  He is checking this at home and reports  It tends to run 140-150/85-90.  He denies chest pain, SOB, cough.   He reports he was in his late 39's when he got his last colonoscopy and he tells me this was normal. He denies a family history of prostate cancer.   He has a history of melanoma on the left chest and tells me he was treated for this in 2015.  He does see dermatology every 6 months.   He quit drinking two years ago.   Adult vaccines due  Topic Date Due  . TETANUS/TDAP  02/06/2027   Health Maintenance  Topic Date Due  . COLONOSCOPY  05/16/2010  . INFLUENZA VACCINE  06/20/2016  . TETANUS/TDAP  02/06/2027  . Hepatitis C Screening  Completed  . HIV Screening  Completed    He has No Known Allergies.   He  has a past medical history of Back pain.    He  reports that he has been smoking Cigarettes.  He has a 27.00 pack-year smoking history. He has never used smokeless tobacco. He reports that he does not drink alcohol or use drugs. He  reports that he does not engage in sexual activity. The patient  has a past surgical history that includes Leg Surgery.  His family history is not on file.  Review of Systems  Constitutional: Negative for chills and fever.  HENT: Negative for sore throat.   Gastrointestinal: Negative for nausea and vomiting.  Musculoskeletal: Negative for myalgias.  Skin: Negative for itching and rash.  Neurological: Negative for dizziness.    The problem list and medications were reviewed and updated by myself where necessary and exist elsewhere in the encounter.   OBJECTIVE:  BP 133/85   Pulse 76   Temp 98.5 F (36.9 C) (Oral)   Resp 16   Ht 5\' 9"   (1.753 m)   Wt 225 lb (102.1 kg)   SpO2 98%   BMI 33.23 kg/m   Physical Exam  Constitutional: He is oriented to person, place, and time.  Cardiovascular: Normal rate, regular rhythm and normal heart sounds.  Exam reveals no gallop, no friction rub and no decreased pulses.   No murmur heard. Pulmonary/Chest: Effort normal and breath sounds normal. No respiratory distress. He has no decreased breath sounds. He has no wheezes. He has no rhonchi. He has no rales.  Musculoskeletal: He exhibits no edema.  Neurological: He is alert and oriented to person, place, and time.  Skin: Skin is warm and dry. He is not diaphoretic.    Lab Results  Component Value Date   CREATININE 0.90 12/26/2016   BUN 14 12/26/2016   NA 143 12/26/2016   K 4.2 12/26/2016   CL 102 12/26/2016   CO2 21 12/26/2016   Lab Results  Component Value Date   WBC 10.5 12/26/2016   HGB 14.8 08/28/2013   HCT 49.3 12/26/2016   MCV 100 (H) 12/26/2016   PLT 286 12/26/2016   Lab Results  Component Value Date   TSH 1.470 12/26/2016   Lab Results  Component Value Date   HGBA1C 5.2 12/26/2016  No results found for this or any previous visit (from the past 72 hour(s)).  No results found.  ASSESSMENT AND PLAN:  [name] was seen today for annual exam and medication refill.  Diagnoses and all orders for this visit:  Annual physical exam  Screening for lipid disorders -     Lipid panel  Screening for colon cancer -     Ambulatory referral to Gastroenterology  Screening for HIV (human immunodeficiency virus) -     Hepatitis C antibody  Need for hepatitis C screening test -     HIV antibody  Essential hypertension, benign -     Lipid panel  Essential hypertension -     lisinopril-hydrochlorothiazide (PRINZIDE,ZESTORETIC) 10-12.5 MG tablet; Take 1 tablet by mouth daily. -     amLODipine (NORVASC) 5 MG tablet; Take 1 tablet (5 mg total) by mouth daily.  Encounter for screening for malignant neoplasm of  lung -     CT CHEST LUNG CA SCREEN LOW DOSE W/O CM; Future  Need for TD vaccine -     Td vaccine greater than or equal to 7yo preservative free IM  Hypertriglyceridemia -     rosuvastatin (CRESTOR) 20 MG tablet; Take 0.5-1 tablets (10-20 mg total) by mouth daily. Take 1/2 tabs daily for the next 7 days, then progress to the full tab.  At risk for acute ischemic cardiac event -     aspirin EC 81 MG tablet; Take 1 tablet (81 mg total) by mouth daily.    The patient is advised to call or return to clinic if he does not see an improvement in symptoms, or to seek the care of the closest emergency department if he worsens with the above plan.   Philis Fendt, MHS, PA-C [facility] 02/09/2017 11:07 AM

## 2017-02-06 LAB — LIPID PANEL
CHOLESTEROL TOTAL: 208 mg/dL — AB (ref 100–199)
Chol/HDL Ratio: 7.7 ratio units — ABNORMAL HIGH (ref 0.0–5.0)
HDL: 27 mg/dL — ABNORMAL LOW (ref >39)
TRIGLYCERIDES: 546 mg/dL — AB (ref 0–149)

## 2017-02-06 LAB — HEPATITIS C ANTIBODY: HEP C VIRUS AB: 0.1 {s_co_ratio} (ref 0.0–0.9)

## 2017-02-06 LAB — HIV ANTIBODY (ROUTINE TESTING W REFLEX): HIV Screen 4th Generation wRfx: NONREACTIVE

## 2017-02-08 DIAGNOSIS — E781 Pure hyperglyceridemia: Secondary | ICD-10-CM | POA: Insufficient documentation

## 2017-02-08 DIAGNOSIS — E785 Hyperlipidemia, unspecified: Secondary | ICD-10-CM | POA: Insufficient documentation

## 2017-02-08 MED ORDER — ASPIRIN EC 81 MG PO TBEC: 81.0000 mg | DELAYED_RELEASE_TABLET | Freq: Every day | ORAL | 0 refills | Status: DC

## 2017-02-08 MED ORDER — ROSUVASTATIN CALCIUM 20 MG PO TABS
10.0000 mg | ORAL_TABLET | Freq: Every day | ORAL | 3 refills | Status: DC
Start: 2017-02-08 — End: 2017-02-12

## 2017-02-08 NOTE — Progress Notes (Signed)
He will start crestor and a statin. CT chest pending.  Colonoscopy pending.

## 2017-02-09 ENCOUNTER — Telehealth: Payer: Self-pay | Admitting: Family Medicine

## 2017-02-09 NOTE — Telephone Encounter (Signed)
Pharmacist is stating that the Crestor that Derek Carroll prescribe for patient cost to much and would like something else please respond

## 2017-02-10 NOTE — Telephone Encounter (Signed)
Subs are pravastatin or simvastain Please send new rx if appropriate

## 2017-02-12 MED ORDER — ATORVASTATIN CALCIUM 20 MG PO TABS: 20.0000 mg | ORAL_TABLET | Freq: Every day | ORAL | 3 refills | Status: DC

## 2017-02-12 NOTE — Telephone Encounter (Signed)
Hi Santiago Glad,  There is an interaction with simvastatin and norvasc that I'd like to try to avoid.  Pravastatin just does not do a great job with lipid reduction even at higher doses.  I have sent in atorvastatin 20 mg.  Please give him and call and advise that I want him to call his pharmacy to see if this is affordable.  If not we will have to try something else.  Philis Fendt, MS, PA-C 1:50 PM, 02/12/2017

## 2017-03-12 ENCOUNTER — Encounter: Payer: Self-pay | Admitting: Physician Assistant

## 2017-03-25 ENCOUNTER — Other Ambulatory Visit: Payer: Self-pay | Admitting: Physician Assistant

## 2017-03-26 ENCOUNTER — Telehealth: Payer: Self-pay

## 2017-03-26 NOTE — Telephone Encounter (Signed)
Called to [facility]

## 2017-03-26 NOTE — Telephone Encounter (Signed)
Left message for patient to call back  

## 2017-03-26 NOTE — Telephone Encounter (Signed)
2017 last refill Last seen 01/2017

## 2017-04-11 ENCOUNTER — Encounter: Payer: Self-pay | Admitting: Internal Medicine

## 2017-05-13 ENCOUNTER — Other Ambulatory Visit: Payer: Self-pay | Admitting: Physician Assistant

## 2017-05-13 DIAGNOSIS — M545 Low back pain, unspecified: Secondary | ICD-10-CM

## 2017-05-13 DIAGNOSIS — M25511 Pain in right shoulder: Secondary | ICD-10-CM

## 2017-06-04 ENCOUNTER — Encounter: Payer: Self-pay | Admitting: Physician Assistant

## 2017-06-18 ENCOUNTER — Encounter: Payer: Medicare Other | Admitting: Internal Medicine

## 2017-08-13 ENCOUNTER — Ambulatory Visit: Payer: Medicare Other | Admitting: Physician Assistant

## 2017-08-13 ENCOUNTER — Telehealth: Payer: Self-pay

## 2017-08-13 NOTE — Telephone Encounter (Signed)
Called pt to schedule Medicare Annual Wellness Visit. -nr  

## 2017-08-31 ENCOUNTER — Ambulatory Visit (INDEPENDENT_AMBULATORY_CARE_PROVIDER_SITE_OTHER): Payer: Medicare Other | Admitting: Physician Assistant

## 2017-08-31 ENCOUNTER — Ambulatory Visit: Payer: Medicare Other | Admitting: Physician Assistant

## 2017-08-31 ENCOUNTER — Encounter: Payer: Self-pay | Admitting: Physician Assistant

## 2017-08-31 VITALS — BP 138/84 | HR 70 | Resp 16 | Ht 69.0 in | Wt 213.6 lb

## 2017-08-31 DIAGNOSIS — M25511 Pain in right shoulder: Secondary | ICD-10-CM | POA: Diagnosis not present

## 2017-08-31 DIAGNOSIS — M2550 Pain in unspecified joint: Secondary | ICD-10-CM | POA: Diagnosis not present

## 2017-08-31 DIAGNOSIS — F17210 Nicotine dependence, cigarettes, uncomplicated: Secondary | ICD-10-CM | POA: Diagnosis not present

## 2017-08-31 DIAGNOSIS — G8929 Other chronic pain: Secondary | ICD-10-CM | POA: Diagnosis not present

## 2017-08-31 DIAGNOSIS — M545 Low back pain, unspecified: Secondary | ICD-10-CM

## 2017-08-31 DIAGNOSIS — I10 Essential (primary) hypertension: Secondary | ICD-10-CM

## 2017-08-31 DIAGNOSIS — E785 Hyperlipidemia, unspecified: Secondary | ICD-10-CM | POA: Diagnosis not present

## 2017-08-31 DIAGNOSIS — Z1211 Encounter for screening for malignant neoplasm of colon: Secondary | ICD-10-CM | POA: Diagnosis not present

## 2017-08-31 LAB — POCT URINALYSIS DIP (MANUAL ENTRY)
BILIRUBIN UA: NEGATIVE mg/dL
Bilirubin, UA: NEGATIVE
Blood, UA: NEGATIVE
Glucose, UA: NEGATIVE mg/dL
LEUKOCYTES UA: NEGATIVE
NITRITE UA: NEGATIVE
PH UA: 7 (ref 5.0–8.0)
PROTEIN UA: NEGATIVE mg/dL
Spec Grav, UA: 1.02 (ref 1.010–1.025)
UROBILINOGEN UA: 0.2 U/dL

## 2017-08-31 MED ORDER — MELOXICAM 15 MG PO TABS: ORAL_TABLET | ORAL | 1 refills | Status: DC

## 2017-08-31 MED ORDER — AMLODIPINE BESYLATE 5 MG PO TABS: 5.0000 mg | ORAL_TABLET | Freq: Every day | ORAL | 3 refills | Status: DC

## 2017-08-31 MED ORDER — TRAMADOL HCL 50 MG PO TABS: ORAL_TABLET | ORAL | 1 refills | Status: DC

## 2017-08-31 MED ORDER — LISINOPRIL-HYDROCHLOROTHIAZIDE 20-12.5 MG PO TABS: 1.0000 | ORAL_TABLET | Freq: Every day | ORAL | 3 refills | Status: DC

## 2017-08-31 NOTE — Progress Notes (Signed)
08/31/2017 11:33 AM   DOB: 09/13/1961 / MRN: [id number]  SUBJECTIVE:  Derek Carroll is a 56 y.o. male presenting for .  Continues to smoke about about 1/2 pack daily.  Tells me his BP is normally under 140/90 and he tends to check this at the drug store. Is complaint with ASA and statin.  Is losing weight intentionally.  Wants me to re-refer him for a CT chest and colonoscopy.   Takes meloxicam and tramadol in a stepwise fashion if he has MSK pain.  Has not needed any refills since last year and does not take if he does not need.   He has No Known Allergies.   He  has a past medical history of Back pain.    He  reports that he has been smoking Cigarettes.  He has a 27.00 pack-year smoking history. He has never used smokeless tobacco. He reports that he does not drink alcohol or use drugs. He  reports that he does not engage in sexual activity. The patient  has a past surgical history that includes Leg Surgery.  His family history is not on file.  Review of Systems  Constitutional: Negative for chills, diaphoresis and fever.  Eyes: Negative.   Respiratory: Negative for cough, hemoptysis, sputum production, shortness of breath and wheezing.   Cardiovascular: Negative for chest pain, orthopnea and leg swelling.  Gastrointestinal: Negative for nausea.  Skin: Negative for rash.  Neurological: Negative for dizziness, sensory change, speech change, focal weakness and headaches.    The problem list and medications were reviewed and updated by myself where necessary and exist elsewhere in the encounter.   OBJECTIVE:  BP 138/84 (BP Location: Left Arm, Patient Position: Sitting, Cuff Size: Large)   Pulse 70   Resp 16   Ht 5\' 9"  (1.753 m)   Wt 213 lb 9.6 oz (96.9 kg)   SpO2 95%   BMI 31.54 kg/m   Physical Exam  Constitutional: He is oriented to person, place, and time. He appears well-developed. He is active and cooperative.  Non-toxic appearance.  Eyes: Pupils are equal, round, and  reactive to light. EOM are normal.  Cardiovascular: Normal rate, regular rhythm, S1 normal, S2 normal, normal heart sounds, intact distal pulses and normal pulses.  Exam reveals no gallop and no friction rub.   No murmur heard. Pulmonary/Chest: Effort normal. No stridor. No tachypnea. No respiratory distress. He has no wheezes. He has no rales.  Abdominal: He exhibits no distension.  Musculoskeletal: Normal range of motion. He exhibits no edema, tenderness or deformity.  Neurological: He is alert and oriented to person, place, and time. He has normal strength and normal reflexes. He is not disoriented. No cranial nerve deficit or sensory deficit. He exhibits normal muscle tone. Coordination and gait normal.  Skin: Skin is warm and dry. He is not diaphoretic. No pallor.  Psychiatric: His behavior is normal.  Vitals reviewed.   Lab Results  Component Value Date   WBC 10.5 12/26/2016   HGB 15.9 12/26/2016   HCT 49.3 12/26/2016   MCV 100 (H) 12/26/2016   PLT 286 12/26/2016    Lab Results  Component Value Date   CREATININE 0.90 12/26/2016   BUN 14 12/26/2016   NA 143 12/26/2016   K 4.2 12/26/2016   CL 102 12/26/2016   CO2 21 12/26/2016    Lab Results  Component Value Date   ALT 23 10/20/2008   AST 19 10/20/2008   ALKPHOS 67 10/20/2008   BILITOT  0.4 10/20/2008    Lab Results  Component Value Date   TSH 1.470 12/26/2016    Lab Results  Component Value Date   HGBA1C 5.2 12/26/2016    Lab Results  Component Value Date   CHOL 208 (H) 02/05/2017   HDL 27 (L) 02/05/2017   LDLCALC Comment 02/05/2017   TRIG 546 (H) 02/05/2017   CHOLHDL 7.7 (H) 02/05/2017    Wt Readings from Last 3 Encounters:  08/31/17 213 lb 9.6 oz (96.9 kg)  02/05/17 225 lb (102.1 kg)  12/26/16 228 lb (103.4 kg)      Results for orders placed or performed in visit on 08/31/17 (from the past 72 hour(s))  POCT urinalysis dipstick     Status: None   Collection Time: 08/31/17 11:32 AM  Result  Value Ref Range   Color, UA yellow yellow   Clarity, UA clear clear   Glucose, UA negative negative mg/dL   Bilirubin, UA negative negative   Ketones, POC UA negative negative mg/dL   Spec Grav, UA 1.020 1.010 - 1.025   Blood, UA negative negative   pH, UA 7.0 5.0 - 8.0   Protein Ur, POC negative negative mg/dL   Urobilinogen, UA 0.2 0.2 or 1.0 E.U./dL   Nitrite, UA Negative Negative   Leukocytes, UA Negative Negative    No results found.  ASSESSMENT AND PLAN:  Saleh was seen today for hypertension and medication refill.  Diagnoses and all orders for this visit:  Essential hypertension: Moving up his dose of lisinopril to 20.   -     lisinopril-hydrochlorothiazide (ZESTORETIC) 20-12.5 MG tablet; Take 1 tablet by mouth daily. -     amLODipine (NORVASC) 5 MG tablet; Take 1 tablet (5 mg total) by mouth daily. -     CMP and Liver -     POCT urinalysis dipstick  Chronic joint pain -     meloxicam (MOBIC) 15 MG tablet; take 0.5 to 1 tablet by mouth daily -     traMADol (ULTRAM) 50 MG tablet; take 0.5 to 1 tablet by mouth every 8 hours if needed  Dyslipidemia: Will eval the lipids at his current dose of medication and given risk factors will increase if needed.  -     Lipid Panel  Acute midline low back pain without sciatica -     meloxicam (MOBIC) 15 MG tablet; take 0.5 to 1 tablet by mouth daily  Pain in joint of right shoulder -     meloxicam (MOBIC) 15 MG tablet; take 0.5 to 1 tablet by mouth daily  Special screening for malignant neoplasms, colon -     Ambulatory referral to Gastroenterology  Smoking greater than 30 pack years -     CT Chest Wo Contrast; Future    The patient is advised to call or return to clinic if he does not see an improvement in symptoms, or to seek the care of the closest emergency department if he worsens with the above plan.   Philis Fendt, MHS, PA-C [facility] 08/31/2017 11:33 AM

## 2017-08-31 NOTE — Patient Instructions (Signed)
     IF you received an x-ray today, you will receive an invoice from [facility]. Please contact [facility] at [phone] with questions or concerns regarding your invoice.   IF you received labwork today, you will receive an invoice from [facility]. Please contact [facility] at [phone] with questions or concerns regarding your invoice.   Our billing staff will not be able to assist you with questions regarding bills from these companies.  You will be contacted with the lab results as soon as they are available. The fastest way to get your results is to activate your My Chart account. Instructions are located on the last page of this paperwork. If you have not heard from us regarding the results in 2 weeks, please contact this office.     

## 2017-09-02 LAB — CMP AND LIVER
ALK PHOS: 76 IU/L (ref 39–117)
ALT: 43 IU/L (ref 0–44)
AST: 28 IU/L (ref 0–40)
Albumin: 4.7 g/dL (ref 3.5–5.5)
BUN: 15 mg/dL (ref 6–24)
Bilirubin Total: 0.4 mg/dL (ref 0.0–1.2)
Bilirubin, Direct: 0.09 mg/dL (ref 0.00–0.40)
CO2: 23 mmol/L (ref 20–29)
CREATININE: 0.98 mg/dL (ref 0.76–1.27)
Calcium: 9.4 mg/dL (ref 8.7–10.2)
Chloride: 101 mmol/L (ref 96–106)
GFR calc Af Amer: 99 mL/min/{1.73_m2} (ref >59)
GFR calc non Af Amer: 85 mL/min/{1.73_m2} (ref >59)
Glucose: 86 mg/dL (ref 65–99)
Potassium: 4.3 mmol/L (ref 3.5–5.2)
SODIUM: 139 mmol/L (ref 134–144)
TOTAL PROTEIN: 7.4 g/dL (ref 6.0–8.5)

## 2017-09-02 LAB — LIPID PANEL
CHOL/HDL RATIO: 3.7 ratio (ref 0.0–5.0)
Cholesterol, Total: 118 mg/dL (ref 100–199)
HDL: 32 mg/dL — ABNORMAL LOW (ref >39)
LDL CALC: 40 mg/dL (ref 0–99)
TRIGLYCERIDES: 228 mg/dL — AB (ref 0–149)
VLDL Cholesterol Cal: 46 mg/dL — ABNORMAL HIGH (ref 5–40)

## 2017-09-03 DIAGNOSIS — L820 Inflamed seborrheic keratosis: Secondary | ICD-10-CM | POA: Diagnosis not present

## 2017-09-03 DIAGNOSIS — Z1283 Encounter for screening for malignant neoplasm of skin: Secondary | ICD-10-CM | POA: Diagnosis not present

## 2017-09-03 DIAGNOSIS — Z85820 Personal history of malignant melanoma of skin: Secondary | ICD-10-CM | POA: Diagnosis not present

## 2017-09-03 DIAGNOSIS — L570 Actinic keratosis: Secondary | ICD-10-CM | POA: Diagnosis not present

## 2017-09-03 DIAGNOSIS — L821 Other seborrheic keratosis: Secondary | ICD-10-CM | POA: Diagnosis not present

## 2017-09-03 DIAGNOSIS — X32XXXD Exposure to sunlight, subsequent encounter: Secondary | ICD-10-CM | POA: Diagnosis not present

## 2017-09-03 DIAGNOSIS — Z08 Encounter for follow-up examination after completed treatment for malignant neoplasm: Secondary | ICD-10-CM | POA: Diagnosis not present

## 2017-09-03 DIAGNOSIS — D0362 Melanoma in situ of left upper limb, including shoulder: Secondary | ICD-10-CM | POA: Diagnosis not present

## 2017-09-03 DIAGNOSIS — B078 Other viral warts: Secondary | ICD-10-CM | POA: Diagnosis not present

## 2017-09-07 ENCOUNTER — Encounter: Payer: Self-pay | Admitting: Physician Assistant

## 2017-09-10 ENCOUNTER — Other Ambulatory Visit: Payer: Self-pay | Admitting: Physician Assistant

## 2017-09-10 MED ORDER — ATORVASTATIN CALCIUM 20 MG PO TABS: 20.0000 mg | ORAL_TABLET | Freq: Every day | ORAL | 3 refills | Status: DC

## 2017-09-10 NOTE — Progress Notes (Signed)
  BP Readings from Last 3 Encounters:  08/31/17 138/84  02/05/17 133/85  12/26/16 (!) 180/104   Lab Results  Component Value Date   CHOL 118 08/31/2017   HDL 32 (L) 08/31/2017   LDLCALC 40 08/31/2017   TRIG 228 (H) 08/31/2017   CHOLHDL 3.7 08/31/2017    LDL is exquisitely low. Will keep his does of atorvastatin the same.

## 2017-09-17 DIAGNOSIS — D0362 Melanoma in situ of left upper limb, including shoulder: Secondary | ICD-10-CM | POA: Diagnosis not present

## 2017-09-17 DIAGNOSIS — L820 Inflamed seborrheic keratosis: Secondary | ICD-10-CM | POA: Diagnosis not present

## 2017-09-24 DIAGNOSIS — L7211 Pilar cyst: Secondary | ICD-10-CM | POA: Diagnosis not present

## 2017-10-18 ENCOUNTER — Telehealth: Payer: Self-pay | Admitting: *Deleted

## 2017-10-18 NOTE — Telephone Encounter (Signed)
Prescription shredded.  Patient did not pick up

## 2017-11-01 ENCOUNTER — Telehealth: Payer: Self-pay

## 2017-11-01 NOTE — Telephone Encounter (Signed)
Called patient to schedule AWV and was unable to leave message due to mailbox being full.

## 2017-12-14 DIAGNOSIS — Z85820 Personal history of malignant melanoma of skin: Secondary | ICD-10-CM | POA: Diagnosis not present

## 2017-12-14 DIAGNOSIS — L570 Actinic keratosis: Secondary | ICD-10-CM | POA: Diagnosis not present

## 2017-12-14 DIAGNOSIS — L820 Inflamed seborrheic keratosis: Secondary | ICD-10-CM | POA: Diagnosis not present

## 2017-12-14 DIAGNOSIS — Z08 Encounter for follow-up examination after completed treatment for malignant neoplasm: Secondary | ICD-10-CM | POA: Diagnosis not present

## 2017-12-14 DIAGNOSIS — X32XXXD Exposure to sunlight, subsequent encounter: Secondary | ICD-10-CM | POA: Diagnosis not present

## 2017-12-14 DIAGNOSIS — Z1283 Encounter for screening for malignant neoplasm of skin: Secondary | ICD-10-CM | POA: Diagnosis not present

## 2018-02-01 DIAGNOSIS — L820 Inflamed seborrheic keratosis: Secondary | ICD-10-CM | POA: Diagnosis not present

## 2018-02-01 DIAGNOSIS — L218 Other seborrheic dermatitis: Secondary | ICD-10-CM | POA: Diagnosis not present

## 2018-02-01 DIAGNOSIS — B078 Other viral warts: Secondary | ICD-10-CM | POA: Diagnosis not present

## 2018-02-21 DIAGNOSIS — R739 Hyperglycemia, unspecified: Secondary | ICD-10-CM | POA: Diagnosis not present

## 2018-02-21 DIAGNOSIS — R7309 Other abnormal glucose: Secondary | ICD-10-CM | POA: Diagnosis not present

## 2018-02-27 DIAGNOSIS — R7303 Prediabetes: Secondary | ICD-10-CM | POA: Insufficient documentation

## 2018-03-14 DIAGNOSIS — L820 Inflamed seborrheic keratosis: Secondary | ICD-10-CM | POA: Diagnosis not present

## 2018-03-14 DIAGNOSIS — L218 Other seborrheic dermatitis: Secondary | ICD-10-CM | POA: Diagnosis not present

## 2018-03-14 DIAGNOSIS — Z1283 Encounter for screening for malignant neoplasm of skin: Secondary | ICD-10-CM | POA: Diagnosis not present

## 2018-03-14 DIAGNOSIS — Z08 Encounter for follow-up examination after completed treatment for malignant neoplasm: Secondary | ICD-10-CM | POA: Diagnosis not present

## 2018-03-14 DIAGNOSIS — D225 Melanocytic nevi of trunk: Secondary | ICD-10-CM | POA: Diagnosis not present

## 2018-03-14 DIAGNOSIS — Z85820 Personal history of malignant melanoma of skin: Secondary | ICD-10-CM | POA: Diagnosis not present

## 2018-04-07 ENCOUNTER — Other Ambulatory Visit: Payer: Self-pay | Admitting: Physician Assistant

## 2018-04-07 DIAGNOSIS — M545 Low back pain, unspecified: Secondary | ICD-10-CM

## 2018-04-07 DIAGNOSIS — M25511 Pain in right shoulder: Secondary | ICD-10-CM

## 2018-04-07 DIAGNOSIS — G8929 Other chronic pain: Secondary | ICD-10-CM

## 2018-04-07 DIAGNOSIS — M2550 Pain in unspecified joint: Principal | ICD-10-CM

## 2018-04-08 NOTE — Telephone Encounter (Signed)
Patient requesting refill on Tramadol mg and Meloxicam 15 mg. Last ordered 7 months ago. Please advise, thank you.

## 2018-05-26 ENCOUNTER — Other Ambulatory Visit: Payer: Self-pay | Admitting: Physician Assistant

## 2018-05-26 DIAGNOSIS — M545 Low back pain, unspecified: Secondary | ICD-10-CM

## 2018-05-26 DIAGNOSIS — G8929 Other chronic pain: Secondary | ICD-10-CM

## 2018-05-26 DIAGNOSIS — M25511 Pain in right shoulder: Secondary | ICD-10-CM

## 2018-05-26 DIAGNOSIS — M2550 Pain in unspecified joint: Secondary | ICD-10-CM

## 2018-05-27 NOTE — Telephone Encounter (Signed)
meloxicam refill Last Refill:04/08/18 # 90 Last OV: 08/31/17 PCP: Philis Fendt PA Pharmacy:[facility] [address]  Tramadol refill Last Refill:04/08/18 # 90 Last OV: 08/31/17

## 2018-05-29 NOTE — Telephone Encounter (Signed)
I need to see him back to see how he is doing. Philis Fendt, MS, PA-C 9:02 AM, 05/29/2018

## 2018-05-31 ENCOUNTER — Ambulatory Visit (INDEPENDENT_AMBULATORY_CARE_PROVIDER_SITE_OTHER): Payer: Medicare Other | Admitting: Physician Assistant

## 2018-05-31 ENCOUNTER — Encounter: Payer: Self-pay | Admitting: Physician Assistant

## 2018-05-31 ENCOUNTER — Other Ambulatory Visit: Payer: Self-pay

## 2018-05-31 VITALS — BP 137/80 | HR 64 | Temp 97.4°F | Resp 16 | Ht 69.0 in | Wt 216.6 lb

## 2018-05-31 DIAGNOSIS — M2550 Pain in unspecified joint: Secondary | ICD-10-CM | POA: Diagnosis not present

## 2018-05-31 DIAGNOSIS — M545 Low back pain, unspecified: Secondary | ICD-10-CM

## 2018-05-31 DIAGNOSIS — I10 Essential (primary) hypertension: Secondary | ICD-10-CM

## 2018-05-31 DIAGNOSIS — M25511 Pain in right shoulder: Secondary | ICD-10-CM | POA: Diagnosis not present

## 2018-05-31 DIAGNOSIS — E785 Hyperlipidemia, unspecified: Secondary | ICD-10-CM | POA: Diagnosis not present

## 2018-05-31 DIAGNOSIS — M549 Dorsalgia, unspecified: Secondary | ICD-10-CM | POA: Diagnosis not present

## 2018-05-31 DIAGNOSIS — G8929 Other chronic pain: Secondary | ICD-10-CM | POA: Diagnosis not present

## 2018-05-31 DIAGNOSIS — F17200 Nicotine dependence, unspecified, uncomplicated: Secondary | ICD-10-CM

## 2018-05-31 DIAGNOSIS — R7303 Prediabetes: Secondary | ICD-10-CM

## 2018-05-31 DIAGNOSIS — F17210 Nicotine dependence, cigarettes, uncomplicated: Secondary | ICD-10-CM

## 2018-05-31 LAB — LIPID PANEL
CHOL/HDL RATIO: 3.1 ratio (ref 0.0–5.0)
CHOLESTEROL TOTAL: 91 mg/dL — AB (ref 100–199)
HDL: 29 mg/dL — ABNORMAL LOW (ref >39)
LDL Calculated: 22 mg/dL (ref 0–99)
Triglycerides: 198 mg/dL — ABNORMAL HIGH (ref 0–149)
VLDL Cholesterol Cal: 40 mg/dL (ref 5–40)

## 2018-05-31 LAB — BASIC METABOLIC PANEL
BUN/Creatinine Ratio: 21 — ABNORMAL HIGH (ref 9–20)
BUN: 20 mg/dL (ref 6–24)
CO2: 19 mmol/L — AB (ref 20–29)
Calcium: 9.2 mg/dL (ref 8.7–10.2)
Chloride: 105 mmol/L (ref 96–106)
Creatinine, Ser: 0.95 mg/dL (ref 0.76–1.27)
GFR calc Af Amer: 102 mL/min/{1.73_m2} (ref >59)
GFR, EST NON AFRICAN AMERICAN: 88 mL/min/{1.73_m2} (ref >59)
GLUCOSE: 119 mg/dL — AB (ref 65–99)
POTASSIUM: 3.8 mmol/L (ref 3.5–5.2)
SODIUM: 140 mmol/L (ref 134–144)

## 2018-05-31 LAB — POCT GLYCOSYLATED HEMOGLOBIN (HGB A1C): Hemoglobin A1C: 5.2 % (ref 4.0–5.6)

## 2018-05-31 MED ORDER — MELOXICAM 15 MG PO TABS: ORAL_TABLET | ORAL | 1 refills | Status: DC

## 2018-05-31 MED ORDER — ATORVASTATIN CALCIUM 20 MG PO TABS: 20.0000 mg | ORAL_TABLET | Freq: Every day | ORAL | 3 refills | Status: DC

## 2018-05-31 MED ORDER — AMLODIPINE BESYLATE 5 MG PO TABS: 5.0000 mg | ORAL_TABLET | Freq: Every day | ORAL | 3 refills | Status: DC

## 2018-05-31 MED ORDER — LISINOPRIL-HYDROCHLOROTHIAZIDE 20-25 MG PO TABS: 1.0000 | ORAL_TABLET | Freq: Every day | ORAL | 3 refills | Status: DC

## 2018-05-31 MED ORDER — TRAMADOL HCL 50 MG PO TABS: ORAL_TABLET | ORAL | 1 refills | Status: DC

## 2018-05-31 NOTE — Patient Instructions (Addendum)
Is good to see you again today Mr. Mcgreal  As far as your daily pain goes he can only take 1 meloxicam daily.  You can take 1 tramadol daily.  Please remember that you can take 1000 mg of Tylenol up to 3 times a day as needed for breakthrough pain.  I would recommend you avoiding NSAIDs such as Motrin, ibuprofen, Aleve, Goody's powder as you are already taking meloxicam which is also an NSAID.  Taking too many NSAIDs can cause a GI bleed or worsening hypertension.  I am increasing your blood pressure medicine very small amount.  You may find that you have to urinate a little more often in the next few days but this should normalize quickly.  As discussed in the office please try to stop smoking.  You could consider getting nicotine gum to replace those 5 to 6 cigarettes that you are having every day.  I would recommend that she moved to eating as little sugar and sweetener as possible.      IF you received an x-ray today, you will receive an invoice from [facility]. Please contact [facility] at [phone] with questions or concerns regarding your invoice.   IF you received labwork today, you will receive an invoice from [facility]. Please contact [facility] at [phone] with questions or concerns regarding your invoice.   Our billing staff will not be able to assist you with questions regarding bills from these companies.  You will be contacted with the lab results as soon as they are available. The fastest way to get your results is to activate your My Chart account. Instructions are located on the last page of this paperwork. If you have not heard from [place] regarding the results in 2 weeks, please contact this office.

## 2018-05-31 NOTE — Progress Notes (Signed)
05/31/2018 10:14 AM   DOB: [birth year] / MRN: [id number]  SUBJECTIVE:  Derek Carroll is a 57 y.o. male presenting for refills of chronic pain medicine.  I have patient taking tramadol and Mobic and had been sometime since I have seen him in clinic.  He requested refills of these online however given his risk factors for heart disease I felt the best that he come back in for a referral recheck.  Symptoms present for years now.  The problem is well-controlled with the current pain regimen. He has tried narcotics along with benzodiazepines in the past however I took him off of these in favor of a safer and less addictive plan.  I also have Derek Carroll taking multiple blood pressure medications, cholesterol medication and aspirin for prophylaxis.  He is a smoker.  Fortunately he is not a diabetic.  He has No Known Allergies.   He  has a past medical history of Back pain.    He  reports that he has been smoking cigarettes.  He has a 27.00 pack-year smoking history. He has never used smokeless tobacco. He reports that he does not drink alcohol or use drugs. He  reports that he does not engage in sexual activity. The patient  has a past surgical history that includes Leg Surgery.  His family history is not on file.  Review of Systems  Constitutional: Negative for chills, diaphoresis and fever.  Eyes: Negative.   Respiratory: Negative for cough, hemoptysis, sputum production, shortness of breath and wheezing.   Cardiovascular: Negative for chest pain, orthopnea and leg swelling.  Gastrointestinal: Negative for abdominal pain, blood in stool, constipation, diarrhea, heartburn, melena, nausea and vomiting.  Genitourinary: Negative for dysuria, flank pain, frequency, hematuria and urgency.  Skin: Negative for rash.  Neurological: Negative for dizziness, sensory change, speech change, focal weakness and headaches.    The problem list and medications were reviewed and updated by myself where  necessary and exist elsewhere in the encounter.   OBJECTIVE:  BP 137/80   Pulse 64   Temp (!) 97.4 F (36.3 C)   Resp 16   Ht 5\' 9"  (1.753 m)   Wt 216 lb 9.6 oz (98.2 kg)   SpO2 94%   BMI 31.99 kg/m   Wt Readings from Last 3 Encounters:  05/31/18 216 lb 9.6 oz (98.2 kg)  08/31/17 213 lb 9.6 oz (96.9 kg)  02/05/17 225 lb (102.1 kg)   Temp Readings from Last 3 Encounters:  05/31/18 (!) 97.4 F (36.3 C)  02/05/17 98.5 F (36.9 C) (Oral)  12/26/16 98 F (36.7 C) (Oral)   BP Readings from Last 3 Encounters:  05/31/18 137/80  08/31/17 138/84  02/05/17 133/85   Pulse Readings from Last 3 Encounters:  05/31/18 64  08/31/17 70  02/05/17 76    Physical Exam  Constitutional: He is oriented to person, place, and time. He appears well-developed. He is active.  Non-toxic appearance. He does not appear ill.  Eyes: Pupils are equal, round, and reactive to light. Conjunctivae and EOM are normal.  Cardiovascular: Normal rate, regular rhythm, S1 normal, S2 normal, normal heart sounds, intact distal pulses and normal pulses. Exam reveals no gallop and no friction rub.  No murmur heard. Pulmonary/Chest: Effort normal. No stridor. No respiratory distress. He has no wheezes. He has no rales.  Abdominal: He exhibits no distension.  Musculoskeletal: Normal range of motion. He exhibits no edema.  Neurological: He is alert and oriented to person, place, and  time. He has normal strength and normal reflexes. He is not disoriented. No cranial nerve deficit or sensory deficit. He exhibits normal muscle tone. Coordination and gait normal.  Skin: Skin is warm and dry. He is not diaphoretic. No pallor.  Psychiatric: He has a normal mood and affect. His behavior is normal.  Nursing note and vitals reviewed.   Lab Results  Component Value Date   HGBA1C 5.2 12/26/2016    Lab Results  Component Value Date   WBC 10.5 12/26/2016   HGB 15.9 12/26/2016   HCT 49.3 12/26/2016   MCV 100 (H)  12/26/2016   PLT 286 12/26/2016    Lab Results  Component Value Date   CREATININE 0.98 08/31/2017   BUN 15 08/31/2017   NA 139 08/31/2017   K 4.3 08/31/2017   CL 101 08/31/2017   CO2 23 08/31/2017    Lab Results  Component Value Date   ALT 43 08/31/2017   AST 28 08/31/2017   ALKPHOS 76 08/31/2017   BILITOT 0.4 08/31/2017    Lab Results  Component Value Date   TSH 1.470 12/26/2016    Lab Results  Component Value Date   CHOL 118 08/31/2017   HDL 32 (L) 08/31/2017   LDLCALC 40 08/31/2017   TRIG 228 (H) 08/31/2017   CHOLHDL 3.7 08/31/2017     ASSESSMENT AND PLAN:  Derek Carroll was seen today for medication refill.:  Patient doing well overall.  Recommend that he see PA Mcveigh on 83-month follow-up.  Diagnoses and all orders for this visit:  Dyslipidemia -     atorvastatin (LIPITOR) 20 MG tablet; Take 1 tablet (20 mg total) by mouth daily. -     Lipid panel  Essential hypertension -     amLODipine (NORVASC) 5 MG tablet; Take 1 tablet (5 mg total) by mouth daily. -     Basic metabolic panel  Smoker  Chronic back pain, unspecified back location, unspecified back pain laterality -     meloxicam (MOBIC) 15 MG tablet; TAKE 1/2 TO 1 TABLET BY MOUTH ONCE DAILY -     traMADol (ULTRAM) 50 MG tablet; TAKE 1/2 TO 1 TABLET BY MOUTH EVERY 8 HOURS IF NEEDED  Acute midline low back pain without sciatica -     meloxicam (MOBIC) 15 MG tablet; TAKE 1/2 TO 1 TABLET BY MOUTH ONCE DAILY  Pain in joint of right shoulder -     meloxicam (MOBIC) 15 MG tablet; TAKE 1/2 TO 1 TABLET BY MOUTH ONCE DAILY  Chronic joint pain -     meloxicam (MOBIC) 15 MG tablet; TAKE 1/2 TO 1 TABLET BY MOUTH ONCE DAILY -     traMADol (ULTRAM) 50 MG tablet; TAKE 1/2 TO 1 TABLET BY MOUTH EVERY 8 HOURS IF NEEDED  Screening for diabetes mellitus  Prediabetes -     POCT glycosylated hemoglobin (Hb A1C)  Other orders -     lisinopril-hydrochlorothiazide (PRINZIDE,ZESTORETIC) 20-25 MG tablet; Take 1 tablet  by mouth daily.    The patient is advised to call or return to clinic if he does not see an improvement in symptoms, or to seek the care of the closest emergency department if he worsens with the above plan.   Philis Fendt, MHS, PA-C [facility] 05/31/2018 10:14 AM

## 2018-06-04 ENCOUNTER — Ambulatory Visit: Payer: Medicare Other | Admitting: Physician Assistant

## 2018-06-13 DIAGNOSIS — Z08 Encounter for follow-up examination after completed treatment for malignant neoplasm: Secondary | ICD-10-CM | POA: Diagnosis not present

## 2018-06-13 DIAGNOSIS — L304 Erythema intertrigo: Secondary | ICD-10-CM | POA: Diagnosis not present

## 2018-06-13 DIAGNOSIS — Z1283 Encounter for screening for malignant neoplasm of skin: Secondary | ICD-10-CM | POA: Diagnosis not present

## 2018-06-13 DIAGNOSIS — L820 Inflamed seborrheic keratosis: Secondary | ICD-10-CM | POA: Diagnosis not present

## 2018-06-13 DIAGNOSIS — Z85820 Personal history of malignant melanoma of skin: Secondary | ICD-10-CM | POA: Diagnosis not present

## 2018-07-02 ENCOUNTER — Telehealth: Payer: Self-pay | Admitting: Physician Assistant

## 2018-07-02 NOTE — Telephone Encounter (Signed)
Called patient in regards to his insurance. His Medicare is saying it is secondary, so we need his primary on file. His mailbox was full so I was not able to leave him a message.

## 2018-09-09 ENCOUNTER — Other Ambulatory Visit: Payer: Self-pay | Admitting: Physician Assistant

## 2018-09-09 DIAGNOSIS — G8929 Other chronic pain: Secondary | ICD-10-CM

## 2018-09-09 DIAGNOSIS — I10 Essential (primary) hypertension: Secondary | ICD-10-CM

## 2018-09-09 DIAGNOSIS — M549 Dorsalgia, unspecified: Secondary | ICD-10-CM

## 2018-09-09 DIAGNOSIS — M2550 Pain in unspecified joint: Principal | ICD-10-CM

## 2018-09-09 NOTE — Telephone Encounter (Signed)
Scheduled the patient an appt with Tenna Delaine on 09/20/2018. Pt states he will be out of his bp medication amLODipine (NORVASC) 5 MG tablet before his appt, is wanting a small amount of medication to last him until his appt

## 2018-09-09 NOTE — Telephone Encounter (Signed)
Patient is requesting a refill of the following medications: Requested Prescriptions   Pending Prescriptions Disp Refills  . traMADol (ULTRAM) 50 MG tablet [Pharmacy Med Name: TRAMADOL 50MG  TABLETS] 90 tablet 0    Sig: TAKE 1/2 TO 1 TABLET BY MOUTH EVERY 8 HOURS AS NEEDED    Date of patient request: 09/09/2018 Last office visit: 05/31/2018 Date of last refill: 05/31/2018 Last refill amount: 90 RF 1 Follow up time period per chart:

## 2018-09-12 MED ORDER — AMLODIPINE BESYLATE 5 MG PO TABS: 5.0000 mg | ORAL_TABLET | Freq: Every day | ORAL | 0 refills | Status: DC

## 2018-09-12 NOTE — Addendum Note (Signed)
Addended by: Rutherford Guys on: 09/12/2018 10:02 PM   Modules accepted: Orders

## 2018-09-20 ENCOUNTER — Encounter: Payer: Self-pay | Admitting: Physician Assistant

## 2018-09-20 ENCOUNTER — Ambulatory Visit (INDEPENDENT_AMBULATORY_CARE_PROVIDER_SITE_OTHER): Payer: Medicare Other | Admitting: Physician Assistant

## 2018-09-20 ENCOUNTER — Other Ambulatory Visit: Payer: Self-pay

## 2018-09-20 VITALS — BP 138/78 | HR 70 | Temp 98.6°F | Resp 16 | Ht 68.75 in | Wt 207.8 lb

## 2018-09-20 DIAGNOSIS — G8929 Other chronic pain: Secondary | ICD-10-CM | POA: Diagnosis not present

## 2018-09-20 DIAGNOSIS — I10 Essential (primary) hypertension: Secondary | ICD-10-CM

## 2018-09-20 DIAGNOSIS — M2550 Pain in unspecified joint: Secondary | ICD-10-CM | POA: Diagnosis not present

## 2018-09-20 DIAGNOSIS — M549 Dorsalgia, unspecified: Secondary | ICD-10-CM

## 2018-09-20 DIAGNOSIS — E785 Hyperlipidemia, unspecified: Secondary | ICD-10-CM

## 2018-09-20 LAB — POCT URINALYSIS DIP (MANUAL ENTRY)
Bilirubin, UA: NEGATIVE
Blood, UA: NEGATIVE
Glucose, UA: NEGATIVE mg/dL
Ketones, POC UA: NEGATIVE mg/dL
LEUKOCYTES UA: NEGATIVE
NITRITE UA: NEGATIVE
PH UA: 5.5 (ref 5.0–8.0)
PROTEIN UA: NEGATIVE mg/dL
Spec Grav, UA: 1.025 (ref 1.010–1.025)
UROBILINOGEN UA: 0.2 U/dL

## 2018-09-20 MED ORDER — TRAMADOL HCL 50 MG PO TABS: ORAL_TABLET | ORAL | 1 refills | Status: DC

## 2018-09-20 MED ORDER — MELOXICAM 15 MG PO TABS: ORAL_TABLET | ORAL | 1 refills | Status: DC

## 2018-09-20 MED ORDER — AMLODIPINE BESYLATE 5 MG PO TABS: 5.0000 mg | ORAL_TABLET | Freq: Every day | ORAL | 1 refills | Status: DC

## 2018-09-20 NOTE — Patient Instructions (Addendum)
Refills provided. Follow up with an orthopedic for chronic back pain.  Follow up here in 6 months.   If you have lab work done today you will be contacted with your lab results within the next 2 weeks.  If you have not heard from [place] then please contact us. The fastest way to get your results is to register for My Chart.   IF you received an x-ray today, you will receive an invoice from [facility]. Please contact [facility] at [phone] with questions or concerns regarding your invoice.   IF you received labwork today, you will receive an invoice from [facility]. Please contact [facility] at [phone] with questions or concerns regarding your invoice.   Our billing staff will not be able to assist you with questions regarding bills from these companies.  You will be contacted with the lab results as soon as they are available. The fastest way to get your results is to activate your My Chart account. Instructions are located on the last page of this paperwork. If you have not heard from [place] regarding the results in 2 weeks, please contact this office.

## 2018-09-20 NOTE — Progress Notes (Signed)
MRN: [id number] DOB: 09/29/1961  Subjective:   Derek Carroll is a 57 y.o. male presenting for follow up for med refills for HTN and pain.   HTN: Currently managed with amlodipine 22m and lisinopril-hctz 20-271mdaily. Patient is not checking blood pressure at home. Denies lightheadedness, dizziness, chronic headache, double vision, chest pain, shortness of breath, heart racing, palpitations, nausea, vomiting, abdominal pain, hematuria, lower leg swelling. Lifestyle: works as a diEngineer, building serviceseats salads, sandwiches, tv dinners.  Smokes 0.5-1ppd.   Chronic back and toe pain: Takes meloxicam 15 mg and tramadol 5085mID x years. Has OA in lumbar spine and OA in hands. Has never seen orthopedic or pain specialist. Denies ilicit drug use.   HLD: Takes lipitor 23m24mily.   RobeOllie a current medication list which includes the following prescription(s): acetaminophen, amlodipine, aspirin ec, atorvastatin, lisinopril-hydrochlorothiazide, meloxicam, multivitamin, and tramadol. Also has No Known Allergies.  RobeLoranzos a past medical history of Back pain. Also  has a past surgical history that includes Leg Surgery.   Objective:   Vitals: BP 138/78 (BP Location: Left Arm, Patient Position: Sitting, Cuff Size: Normal)   Pulse 70   Temp 98.6 F (37 C) (Oral)   Resp 16   Ht 5' 8.75" (1.746 m)   Wt 207 lb 12.8 oz (94.3 kg)   SpO2 96%   BMI 30.91 kg/m   Physical Exam  Constitutional: He is oriented to person, place, and time. He appears well-developed and well-nourished. No distress.  HENT:  Head: Normocephalic and atraumatic.  Mouth/Throat: Uvula is midline, oropharynx is clear and moist and mucous membranes are normal. No tonsillar exudate.  Eyes: Pupils are equal, round, and reactive to light. Conjunctivae and EOM are normal.  Neck: Normal range of motion.  Cardiovascular: Normal rate, regular rhythm, normal heart sounds and intact distal pulses.  Pulmonary/Chest: Effort normal  and breath sounds normal. He has no decreased breath sounds. He has no wheezes. He has no rhonchi. He has no rales.  Musculoskeletal:       Right lower leg: He exhibits no swelling.       Left lower leg: He exhibits no swelling.  Neurological: He is alert and oriented to person, place, and time.  Skin: Skin is warm and dry.  Psychiatric: He has a normal mood and affect.  Vitals reviewed.   No results found for this or any previous visit (from the past 24 hour(s)). Wt Readings from Last 3 Encounters:  09/20/18 207 lb 12.8 oz (94.3 kg)  05/31/18 216 lb 9.6 oz (98.2 kg)  08/31/17 213 lb 9.6 oz (96.9 kg)    Assessment and Plan :  1. Essential hypertension Controlled.  Continue current medication regimen.  Labs pending.  Follow-up in 6 months for evaluation. - CMP14+EGFR - CBC with Differential/Platelet - TSH - POCT urinalysis dipstick - amLODipine (NORVASC) 5 MG tablet; Take 1 tablet (5 mg total) by mouth daily.  Dispense: 90 tablet; Refill: 1  2. Chronic back pain, unspecified back location, unspecified back pain laterality Refills provided.  Labs pending.  Discussed using meloxicam daily and tramadol only as needed.  If he is going to continue with chronic use of tramadol, will need to be further evaluated by orthopedic surgeon.  Referral placed.  [place] and is consistent with patient's history. - Ambulatory referral to Orthopedic Surgery - ToxASSURE Select 13 (MW), Urine - meloxicam (MOBIC) 15 MG tablet; TAKE 1/2 TO 1 TABLET  BY MOUTH ONCE DAILY  Dispense: 90 tablet; Refill: 1 - traMADol (ULTRAM) 50 MG tablet; TAKE 1/2 TO 1 TABLET BY MOUTH EVERY 8 HOURS AS NEEDED  Dispense: 90 tablet; Refill: 1  3. Chronic joint pain - ToxASSURE Select 13 (MW), Urine - meloxicam (MOBIC) 15 MG tablet; TAKE 1/2 TO 1 TABLET BY MOUTH ONCE DAILY  Dispense: 90 tablet; Refill: 1 - traMADol (ULTRAM) 50 MG tablet; TAKE 1/2 TO 1 TABLET BY MOUTH EVERY  8 HOURS AS NEEDED  Dispense: 90 tablet; Refill: 1  4. Dyslipidemia    Tenna Delaine, PA-C  [facility] 09/20/2018 11:05 AM

## 2018-09-21 ENCOUNTER — Other Ambulatory Visit: Payer: Self-pay | Admitting: Physician Assistant

## 2018-09-21 DIAGNOSIS — M549 Dorsalgia, unspecified: Secondary | ICD-10-CM

## 2018-09-21 DIAGNOSIS — G8929 Other chronic pain: Secondary | ICD-10-CM

## 2018-09-21 DIAGNOSIS — M2550 Pain in unspecified joint: Principal | ICD-10-CM

## 2018-09-21 LAB — CMP14+EGFR
A/G RATIO: 1.6 (ref 1.2–2.2)
ALK PHOS: 65 IU/L (ref 39–117)
ALT: 35 IU/L (ref 0–44)
AST: 31 IU/L (ref 0–40)
Albumin: 4.4 g/dL (ref 3.5–5.5)
BILIRUBIN TOTAL: 0.6 mg/dL (ref 0.0–1.2)
BUN/Creatinine Ratio: 29 — ABNORMAL HIGH (ref 9–20)
BUN: 25 mg/dL — ABNORMAL HIGH (ref 6–24)
CALCIUM: 9.5 mg/dL (ref 8.7–10.2)
CHLORIDE: 104 mmol/L (ref 96–106)
CO2: 24 mmol/L (ref 20–29)
Creatinine, Ser: 0.85 mg/dL (ref 0.76–1.27)
GFR calc Af Amer: 111 mL/min/{1.73_m2} (ref >59)
GFR, EST NON AFRICAN AMERICAN: 96 mL/min/{1.73_m2} (ref >59)
Globulin, Total: 2.7 g/dL (ref 1.5–4.5)
Glucose: 69 mg/dL (ref 65–99)
POTASSIUM: 4.4 mmol/L (ref 3.5–5.2)
Sodium: 142 mmol/L (ref 134–144)
Total Protein: 7.1 g/dL (ref 6.0–8.5)

## 2018-09-21 LAB — CBC WITH DIFFERENTIAL/PLATELET
BASOS ABS: 0.1 10*3/uL (ref 0.0–0.2)
Basos: 1 %
EOS (ABSOLUTE): 0.5 10*3/uL — AB (ref 0.0–0.4)
Eos: 6 %
Hematocrit: 42.8 % (ref 37.5–51.0)
Hemoglobin: 14.5 g/dL (ref 13.0–17.7)
IMMATURE GRANULOCYTES: 0 %
Immature Grans (Abs): 0 10*3/uL (ref 0.0–0.1)
Lymphocytes Absolute: 2.6 10*3/uL (ref 0.7–3.1)
Lymphs: 31 %
MCH: 32.3 pg (ref 26.6–33.0)
MCHC: 33.9 g/dL (ref 31.5–35.7)
MCV: 95 fL (ref 79–97)
MONOS ABS: 0.8 10*3/uL (ref 0.1–0.9)
Monocytes: 10 %
NEUTROS PCT: 52 %
Neutrophils Absolute: 4.3 10*3/uL (ref 1.4–7.0)
PLATELETS: 302 10*3/uL (ref 150–450)
RBC: 4.49 x10E6/uL (ref 4.14–5.80)
RDW: 11.8 % — AB (ref 12.3–15.4)
WBC: 8.3 10*3/uL (ref 3.4–10.8)

## 2018-09-21 LAB — TSH: TSH: 1.05 u[IU]/mL (ref 0.450–4.500)

## 2018-09-25 ENCOUNTER — Encounter: Payer: Self-pay | Admitting: Radiology

## 2018-09-26 ENCOUNTER — Ambulatory Visit (INDEPENDENT_AMBULATORY_CARE_PROVIDER_SITE_OTHER): Payer: PRIVATE HEALTH INSURANCE | Admitting: Family Medicine

## 2018-09-26 ENCOUNTER — Encounter (INDEPENDENT_AMBULATORY_CARE_PROVIDER_SITE_OTHER): Payer: Self-pay | Admitting: Family Medicine

## 2018-09-26 DIAGNOSIS — M545 Low back pain, unspecified: Secondary | ICD-10-CM

## 2018-09-26 DIAGNOSIS — G8929 Other chronic pain: Secondary | ICD-10-CM

## 2018-09-26 LAB — TOXASSURE SELECT 13 (MW), URINE

## 2018-09-26 MED ORDER — TRAMADOL HCL 50 MG PO TABS: 50.0000 mg | ORAL_TABLET | Freq: Two times a day (BID) | ORAL | 0 refills | Status: DC

## 2018-09-26 MED ORDER — TRAMADOL HCL 50 MG PO TABS: 50.0000 mg | ORAL_TABLET | Freq: Two times a day (BID) | ORAL | 5 refills | Status: DC

## 2018-09-26 NOTE — Progress Notes (Signed)
   Office Visit Note   Patient: Derek Carroll           Date of Birth: [birth year]           MRN: [id number] Visit Date: 09/26/2018 Requested by: Leonie Douglas, PA-C [place], [place] [address] PCP: Horald Pollen, MD  Subjective: Chief Complaint  Patient presents with  . chronic LBP, bil hand pain, left lower leg pain    HPI: He is a 57 year old seen at the request of [place] for low back pain.  Symptoms started years ago, he thinks after breaking his left tibia which required ORIF.  Ever since then he has walked with a slight limp which seems to have made his back started hurting.  He gets aching and stiffness in the lower back.  He had x-rays in 2015 which were reviewed on computer today showing mild degenerative disc disease at L5-S1 along with calcification of the abdominal aorta.  He has sclerotic changes of the SI joints as well, hip joints look good.  He has done well with meloxicam and tramadol.  His new PCP wanted him seen by orthopedics before prescribing this.  Patient has not been to physical therapy or a chiropractor for treatment.  Denies any radicular type symptoms.  He smokes cigarettes, roughly 1/2 pack daily.  He is trying to quit, trying to be healthier overall.  He has made some dietary changes to manage his blood pressure and blood sugars.               ROS: He has hypertension and prediabetes, hyperlipidemia.  Other systems were negative.  Objective: Vital Signs: There were no vitals taken for this visit.  Physical Exam:  Low back: No visible rash.  He is tender in the paraspinous muscles in the lower lumbar spine bilaterally.  No significant SI tenderness today, no pain in the sciatic notch.  Negative straight leg raise, lower extremity strength and reflexes are normal.  Imaging: No new x-rays today.  Assessment & Plan: 1.  Chronic low back pain with degenerative disc disease -I have agreed to refill his tramadol as long as his  dosage requirement does not increase.  He takes 1 pill twice daily as needed. -Could try physical therapy or chiropractic if symptoms worsen. -Follow-up in about 6 months, sooner for any problems.   Follow-Up Instructions: No follow-ups on file.      Procedures: No procedures performed  No notes on file    PMFS History: Patient Active Problem List   Diagnosis Date Noted  . Pre-diabetes 02/27/2018  . Hypertriglyceridemia 02/08/2017  . Dyslipidemia 02/08/2017  . Essential hypertension 12/26/2016  . Chronic joint pain 08/02/2016   Past Medical History:  Diagnosis Date  . Back pain     History reviewed. No pertinent family history.  Past Surgical History:  Procedure Laterality Date  . LEG SURGERY     Social History   Occupational History  . Not on file  Tobacco Use  . Smoking status: Current Every Day Smoker    Packs/day: 0.75    Years: 36.00    Pack years: 27.00    Types: Cigarettes  . Smokeless tobacco: Never Used  Substance and Sexual Activity  . Alcohol use: No    Comment: Quit January 2016  . Drug use: No  . Sexual activity: Never

## 2018-10-03 ENCOUNTER — Ambulatory Visit (INDEPENDENT_AMBULATORY_CARE_PROVIDER_SITE_OTHER): Payer: PRIVATE HEALTH INSURANCE | Admitting: Emergency Medicine

## 2018-10-03 ENCOUNTER — Encounter: Payer: Self-pay | Admitting: Emergency Medicine

## 2018-10-03 ENCOUNTER — Other Ambulatory Visit: Payer: Self-pay

## 2018-10-03 VITALS — BP 117/71 | HR 62 | Temp 98.0°F | Resp 16 | Ht 68.25 in | Wt 208.6 lb

## 2018-10-03 DIAGNOSIS — N529 Male erectile dysfunction, unspecified: Secondary | ICD-10-CM

## 2018-10-03 DIAGNOSIS — I10 Essential (primary) hypertension: Secondary | ICD-10-CM | POA: Diagnosis not present

## 2018-10-03 MED ORDER — SILDENAFIL CITRATE 100 MG PO TABS: 50.0000 mg | ORAL_TABLET | Freq: Every day | ORAL | 11 refills | Status: DC | PRN

## 2018-10-03 NOTE — Progress Notes (Signed)
Derek Carroll 57 y.o.   Chief Complaint  Patient presents with  . Hypertension    per patient last office visit he was told to come back for EKG to get medication for VIAGRA    HISTORY OF PRESENT ILLNESS: This is a 57 y.o. male complaining of erectile dysfunction.  Has a history of hypertension, well-controlled medications, seen here last on 09/20/2018, and told he needed to get an EKG before getting prescription for Viagra.  Patient has no history of coronary artery disease, denies chest pain on exertion, denies difficulty breathing on exertion.  No significant symptomatology  HPI   Prior to Admission medications   Medication Sig Start Date End Date Taking? Authorizing Provider  acetaminophen (TYLENOL) 500 MG tablet Take 1,000 mg by mouth every 6 (six) hours as needed.   Yes [provider]  amLODipine (NORVASC) 5 MG tablet Take 1 tablet (5 mg total) by mouth daily. 09/20/18  Yes Leonie Douglas, PA-C  aspirin EC 81 MG tablet Take 1 tablet (81 mg total) by mouth daily. 02/08/17  Yes Tereasa Coop, PA-C  atorvastatin (LIPITOR) 20 MG tablet Take 1 tablet (20 mg total) by mouth daily. 05/31/18  Yes Tereasa Coop, PA-C  lisinopril-hydrochlorothiazide (PRINZIDE,ZESTORETIC) 20-25 MG tablet Take 1 tablet by mouth daily. 05/31/18  Yes Tereasa Coop, PA-C  meloxicam (MOBIC) 15 MG tablet TAKE 1/2 TO 1 TABLET BY MOUTH ONCE DAILY 09/20/18  Yes Tenna Delaine D, PA-C  Multiple Vitamin (MULTIVITAMIN) tablet Take 1 tablet by mouth daily.   Yes [provider]  traMADol (ULTRAM) 50 MG tablet TAKE 1/2 TO 1 TABLET BY MOUTH EVERY 8 HOURS AS NEEDED 09/20/18  Yes Timmothy Euler, [place] D, PA-C  sildenafil (VIAGRA) 100 MG tablet Take 0.5-1 tablets (50-100 mg total) by mouth daily as needed for erectile dysfunction. 10/03/18   Horald Pollen, MD  traMADol (ULTRAM) 50 MG tablet Take 1 tablet (50 mg total) by mouth 2 (two) times daily. 09/26/18   Hilts, Legrand Como, MD    No Known  Allergies  Patient Active Problem List   Diagnosis Date Noted  . Pre-diabetes 02/27/2018  . Hypertriglyceridemia 02/08/2017  . Dyslipidemia 02/08/2017  . Essential hypertension 12/26/2016  . Chronic joint pain 08/02/2016    Past Medical History:  Diagnosis Date  . Back pain     Past Surgical History:  Procedure Laterality Date  . LEG SURGERY      Social History   Socioeconomic History  . Marital status: Single    Spouse name: Not on file  . Number of children: Not on file  . Years of education: Not on file  . Highest education level: Not on file  Occupational History  . Not on file  Social Needs  . Financial resource strain: Not on file  . Food insecurity:    Worry: Not on file    Inability: Not on file  . Transportation needs:    Medical: Not on file    Non-medical: Not on file  Tobacco Use  . Smoking status: Current Every Day Smoker    Packs/day: 0.75    Years: 36.00    Pack years: 27.00    Types: Cigarettes  . Smokeless tobacco: Never Used  Substance and Sexual Activity  . Alcohol use: No    Comment: Quit January 2016  . Drug use: No  . Sexual activity: Never  Lifestyle  . Physical activity:    Days per week: Not on file    Minutes per  session: Not on file  . Stress: Not on file  Relationships  . Social connections:    Talks on phone: Not on file    Gets together: Not on file    Attends religious service: Not on file    Active member of club or organization: Not on file    Attends meetings of clubs or organizations: Not on file    Relationship status: Not on file  . Intimate partner violence:    Fear of current or ex partner: Not on file    Emotionally abused: Not on file    Physically abused: Not on file    Forced sexual activity: Not on file  Other Topics Concern  . Not on file  Social History Narrative  . Not on file    No family history on file.   Review of Systems  Constitutional: Negative.  Negative for chills and fever.  HENT:  Negative.   Eyes: Negative.  Negative for blurred vision and double vision.  Respiratory: Negative.  Negative for cough and shortness of breath.   Cardiovascular: Negative.  Negative for chest pain and palpitations.  Gastrointestinal: Negative.  Negative for abdominal pain, diarrhea, nausea and vomiting.  Genitourinary: Negative.  Negative for dysuria and hematuria.  Musculoskeletal: Negative.  Negative for back pain, myalgias and neck pain.  Skin: Negative.  Negative for rash.  Neurological: Negative.  Negative for dizziness and headaches.  Endo/Heme/Allergies: Negative.   All other systems reviewed and are negative.   Vitals:   10/03/18 1146  BP: 117/71  Pulse: 62  Resp: 16  Temp: 98 F (36.7 C)  SpO2: 95%    Physical Exam  Constitutional: He is oriented to person, place, and time. He appears well-developed and well-nourished.  HENT:  Head: Normocephalic and atraumatic.  Eyes: Pupils are equal, round, and reactive to light. EOM are normal.  Neck: Normal range of motion. Neck supple.  Cardiovascular: Normal rate, regular rhythm and normal heart sounds.  Pulmonary/Chest: Effort normal and breath sounds normal.  Musculoskeletal: Normal range of motion.  Neurological: He is alert and oriented to person, place, and time. No sensory deficit. He exhibits normal muscle tone. Coordination normal.  Skin: Skin is warm and dry. Capillary refill takes less than 2 seconds.  Psychiatric: He has a normal mood and affect. His behavior is normal.  Vitals reviewed.  EKG: Normal sinus rhythm with ventricular rate of 64.  PVC observed.  No acute ischemic changes.  A total of 25 minutes was spent in the room with the patient, greater than 50% of which was in counseling/coordination of care regarding diagnosis, treatment, medications, and need for follow-up.  ASSESSMENT & PLAN: Derek Carroll was seen today for hypertension.  Diagnoses and all orders for this visit:  Erectile dysfunction,  unspecified erectile dysfunction type -     sildenafil (VIAGRA) 100 MG tablet; Take 0.5-1 tablets (50-100 mg total) by mouth daily as needed for erectile dysfunction.  Essential hypertension -     EKG 12-Lead    Patient Instructions       If you have lab work done today you will be contacted with your lab results within the next 2 weeks.  If you have not heard from [place] then please contact us. The fastest way to get your results is to register for My Chart.   IF you received an x-ray today, you will receive an invoice from [facility]. Please contact [facility] at [phone] with questions or concerns regarding your invoice.  IF you received labwork today, you will receive an invoice from [facility]. Please contact [facility] at [phone] with questions or concerns regarding your invoice.   Our billing staff will not be able to assist you with questions regarding bills from these companies.  You will be contacted with the lab results as soon as they are available. The fastest way to get your results is to activate your My Chart account. Instructions are located on the last page of this paperwork. If you have not heard from [place] regarding the results in 2 weeks, please contact this office.     Hypertension Hypertension, commonly called high blood pressure, is when the force of blood pumping through the arteries is too strong. The arteries are the blood vessels that carry blood from the heart throughout the body. Hypertension forces the heart to work harder to pump blood and may cause arteries to become narrow or stiff. Having untreated or uncontrolled hypertension can cause heart attacks, strokes, kidney disease, and other problems. A blood pressure reading consists of a higher number over a lower number. Ideally, your blood pressure should be below 120/80. The first ("top") number is called the systolic pressure. It is a measure of the pressure in your arteries as your  heart beats. The second ("bottom") number is called the diastolic pressure. It is a measure of the pressure in your arteries as the heart relaxes. What are the causes? The cause of this condition is not known. What increases the risk? Some risk factors for high blood pressure are under your control. Others are not. Factors you can change  Smoking.  Having type 2 diabetes mellitus, high cholesterol, or both.  Not getting enough exercise or physical activity.  Being overweight.  Having too much fat, sugar, calories, or salt (sodium) in your diet.  Drinking too much alcohol. Factors that are difficult or impossible to change  Having chronic kidney disease.  Having a family history of high blood pressure.  Age. Risk increases with age.  Race. You may be at higher risk if you are African-American.  Gender. Men are at higher risk than women before age 60. After age 17, women are at higher risk than men.  Having obstructive sleep apnea.  Stress. What are the signs or symptoms? Extremely high blood pressure (hypertensive crisis) may cause:  Headache.  Anxiety.  Shortness of breath.  Nosebleed.  Nausea and vomiting.  Severe chest pain.  Jerky movements you cannot control (seizures).  How is this diagnosed? This condition is diagnosed by measuring your blood pressure while you are seated, with your arm resting on a surface. The cuff of the blood pressure monitor will be placed directly against the skin of your upper arm at the level of your heart. It should be measured at least twice using the same arm. Certain conditions can cause a difference in blood pressure between your right and left arms. Certain factors can cause blood pressure readings to be lower or higher than normal (elevated) for a short period of time:  When your blood pressure is higher when you are in a health care provider's office than when you are at home, this is called white coat hypertension. Most  people with this condition do not need medicines.  When your blood pressure is higher at home than when you are in a health care provider's office, this is called masked hypertension. Most people with this condition may need medicines to control blood pressure.  If you have a high blood  pressure reading during one visit or you have normal blood pressure with other risk factors:  You may be asked to return on a different day to have your blood pressure checked again.  You may be asked to monitor your blood pressure at home for 1 week or longer.  If you are diagnosed with hypertension, you may have other blood or imaging tests to help your health care provider understand your overall risk for other conditions. How is this treated? This condition is treated by making healthy lifestyle changes, such as eating healthy foods, exercising more, and reducing your alcohol intake. Your health care provider may prescribe medicine if lifestyle changes are not enough to get your blood pressure under control, and if:  Your systolic blood pressure is above 130.  Your diastolic blood pressure is above 80.  Your personal target blood pressure may vary depending on your medical conditions, your age, and other factors. Follow these instructions at home: Eating and drinking  Eat a diet that is high in fiber and potassium, and low in sodium, added sugar, and fat. An example eating plan is called the DASH (Dietary Approaches to Stop Hypertension) diet. To eat this way: ? Eat plenty of fresh fruits and vegetables. Try to fill half of your plate at each meal with fruits and vegetables. ? Eat whole grains, such as whole wheat pasta, brown rice, or whole grain bread. Fill about one quarter of your plate with whole grains. ? Eat or drink low-fat dairy products, such as skim milk or low-fat yogurt. ? Avoid fatty cuts of meat, processed or cured meats, and poultry with skin. Fill about one quarter of your plate with  lean proteins, such as fish, chicken without skin, beans, eggs, and tofu. ? Avoid premade and processed foods. These tend to be higher in sodium, added sugar, and fat.  Reduce your daily sodium intake. Most people with hypertension should eat less than 1,500 mg of sodium a day.  Limit alcohol intake to no more than 1 drink a day for nonpregnant women and 2 drinks a day for men. One drink equals 12 oz of beer, 5 oz of wine, or 1 oz of hard liquor. Lifestyle  Work with your health care provider to maintain a healthy body weight or to lose weight. Ask what an ideal weight is for you.  Get at least 30 minutes of exercise that causes your heart to beat faster (aerobic exercise) most days of the week. Activities may include walking, swimming, or biking.  Include exercise to strengthen your muscles (resistance exercise), such as pilates or lifting weights, as part of your weekly exercise routine. Try to do these types of exercises for 30 minutes at least 3 days a week.  Do not use any products that contain nicotine or tobacco, such as cigarettes and e-cigarettes. If you need help quitting, ask your health care provider.  Monitor your blood pressure at home as told by your health care provider.  Keep all follow-up visits as told by your health care provider. This is important. Medicines  Take over-the-counter and prescription medicines only as told by your health care provider. Follow directions carefully. Blood pressure medicines must be taken as prescribed.  Do not skip doses of blood pressure medicine. Doing this puts you at risk for problems and can make the medicine less effective.  Ask your health care provider about side effects or reactions to medicines that you should watch for. Contact a health care provider if:  You  think you are having a reaction to a medicine you are taking.  You have headaches that keep coming back (recurring).  You feel dizzy.  You have swelling in your  ankles.  You have trouble with your vision. Get help right away if:  You develop a severe headache or confusion.  You have unusual weakness or numbness.  You feel faint.  You have severe pain in your chest or abdomen.  You vomit repeatedly.  You have trouble breathing. Summary  Hypertension is when the force of blood pumping through your arteries is too strong. If this condition is not controlled, it may put you at risk for serious complications.  Your personal target blood pressure may vary depending on your medical conditions, your age, and other factors. For most people, a normal blood pressure is less than 120/80.  Hypertension is treated with lifestyle changes, medicines, or a combination of both. Lifestyle changes include weight loss, eating a healthy, low-sodium diet, exercising more, and limiting alcohol. This information is not intended to replace advice given to you by your health care provider. Make sure you discuss any questions you have with your health care provider. Document Released: 11/06/2005 Document Revised: 10/04/2016 Document Reviewed: 10/04/2016 Elsevier Interactive Patient Education  2018 Elsevier Inc.       Agustina Caroli, MD [facility]

## 2018-10-03 NOTE — Patient Instructions (Addendum)
   If you have lab work done today you will be contacted with your lab results within the next 2 weeks.  If you have not heard from us then please contact us. The fastest way to get your results is to register for My Chart.   IF you received an x-ray today, you will receive an invoice from [facility]. Please contact [facility] at [phone] with questions or concerns regarding your invoice.   IF you received labwork today, you will receive an invoice from [facility]. Please contact [facility] at [phone] with questions or concerns regarding your invoice.   Our billing staff will not be able to assist you with questions regarding bills from these companies.  You will be contacted with the lab results as soon as they are available. The fastest way to get your results is to activate your My Chart account. Instructions are located on the last page of this paperwork. If you have not heard from us regarding the results in 2 weeks, please contact this office.     Hypertension Hypertension, commonly called high blood pressure, is when the force of blood pumping through the arteries is too strong. The arteries are the blood vessels that carry blood from the heart throughout the body. Hypertension forces the heart to work harder to pump blood and may cause arteries to become narrow or stiff. Having untreated or uncontrolled hypertension can cause heart attacks, strokes, kidney disease, and other problems. A blood pressure reading consists of a higher number over a lower number. Ideally, your blood pressure should be below 120/80. The first ("top") number is called the systolic pressure. It is a measure of the pressure in your arteries as your heart beats. The second ("bottom") number is called the diastolic pressure. It is a measure of the pressure in your arteries as the heart relaxes. What are the causes? The cause of this condition is not known. What increases the risk? Some  risk factors for high blood pressure are under your control. Others are not. Factors you can change  Smoking.  Having type 2 diabetes mellitus, high cholesterol, or both.  Not getting enough exercise or physical activity.  Being overweight.  Having too much fat, sugar, calories, or salt (sodium) in your diet.  Drinking too much alcohol. Factors that are difficult or impossible to change  Having chronic kidney disease.  Having a family history of high blood pressure.  Age. Risk increases with age.  Race. You may be at higher risk if you are African-American.  Gender. Men are at higher risk than women before age 45. After age 65, women are at higher risk than men.  Having obstructive sleep apnea.  Stress. What are the signs or symptoms? Extremely high blood pressure (hypertensive crisis) may cause:  Headache.  Anxiety.  Shortness of breath.  Nosebleed.  Nausea and vomiting.  Severe chest pain.  Jerky movements you cannot control (seizures).  How is this diagnosed? This condition is diagnosed by measuring your blood pressure while you are seated, with your arm resting on a surface. The cuff of the blood pressure monitor will be placed directly against the skin of your upper arm at the level of your heart. It should be measured at least twice using the same arm. Certain conditions can cause a difference in blood pressure between your right and left arms. Certain factors can cause blood pressure readings to be lower or higher than normal (elevated) for a short period of time:  When   your blood pressure is higher when you are in a health care provider's office than when you are at home, this is called white coat hypertension. Most people with this condition do not need medicines.  When your blood pressure is higher at home than when you are in a health care provider's office, this is called masked hypertension. Most people with this condition may need medicines to control  blood pressure.  If you have a high blood pressure reading during one visit or you have normal blood pressure with other risk factors:  You may be asked to return on a different day to have your blood pressure checked again.  You may be asked to monitor your blood pressure at home for 1 week or longer.  If you are diagnosed with hypertension, you may have other blood or imaging tests to help your health care provider understand your overall risk for other conditions. How is this treated? This condition is treated by making healthy lifestyle changes, such as eating healthy foods, exercising more, and reducing your alcohol intake. Your health care provider may prescribe medicine if lifestyle changes are not enough to get your blood pressure under control, and if:  Your systolic blood pressure is above 130.  Your diastolic blood pressure is above 80.  Your personal target blood pressure may vary depending on your medical conditions, your age, and other factors. Follow these instructions at home: Eating and drinking  Eat a diet that is high in fiber and potassium, and low in sodium, added sugar, and fat. An example eating plan is called the DASH (Dietary Approaches to Stop Hypertension) diet. To eat this way: ? Eat plenty of fresh fruits and vegetables. Try to fill half of your plate at each meal with fruits and vegetables. ? Eat whole grains, such as whole wheat pasta, brown rice, or whole grain bread. Fill about one quarter of your plate with whole grains. ? Eat or drink low-fat dairy products, such as skim milk or low-fat yogurt. ? Avoid fatty cuts of meat, processed or cured meats, and poultry with skin. Fill about one quarter of your plate with lean proteins, such as fish, chicken without skin, beans, eggs, and tofu. ? Avoid premade and processed foods. These tend to be higher in sodium, added sugar, and fat.  Reduce your daily sodium intake. Most people with hypertension should eat less  than 1,500 mg of sodium a day.  Limit alcohol intake to no more than 1 drink a day for nonpregnant women and 2 drinks a day for men. One drink equals 12 oz of beer, 5 oz of wine, or 1 oz of hard liquor. Lifestyle  Work with your health care provider to maintain a healthy body weight or to lose weight. Ask what an ideal weight is for you.  Get at least 30 minutes of exercise that causes your heart to beat faster (aerobic exercise) most days of the week. Activities may include walking, swimming, or biking.  Include exercise to strengthen your muscles (resistance exercise), such as pilates or lifting weights, as part of your weekly exercise routine. Try to do these types of exercises for 30 minutes at least 3 days a week.  Do not use any products that contain nicotine or tobacco, such as cigarettes and e-cigarettes. If you need help quitting, ask your health care provider.  Monitor your blood pressure at home as told by your health care provider.  Keep all follow-up visits as told by your health care provider.   This is important. Medicines  Take over-the-counter and prescription medicines only as told by your health care provider. Follow directions carefully. Blood pressure medicines must be taken as prescribed.  Do not skip doses of blood pressure medicine. Doing this puts you at risk for problems and can make the medicine less effective.  Ask your health care provider about side effects or reactions to medicines that you should watch for. Contact a health care provider if:  You think you are having a reaction to a medicine you are taking.  You have headaches that keep coming back (recurring).  You feel dizzy.  You have swelling in your ankles.  You have trouble with your vision. Get help right away if:  You develop a severe headache or confusion.  You have unusual weakness or numbness.  You feel faint.  You have severe pain in your chest or abdomen.  You vomit  repeatedly.  You have trouble breathing. Summary  Hypertension is when the force of blood pumping through your arteries is too strong. If this condition is not controlled, it may put you at risk for serious complications.  Your personal target blood pressure may vary depending on your medical conditions, your age, and other factors. For most people, a normal blood pressure is less than 120/80.  Hypertension is treated with lifestyle changes, medicines, or a combination of both. Lifestyle changes include weight loss, eating a healthy, low-sodium diet, exercising more, and limiting alcohol. This information is not intended to replace advice given to you by your health care provider. Make sure you discuss any questions you have with your health care provider. Document Released: 11/06/2005 Document Revised: 10/04/2016 Document Reviewed: 10/04/2016 Elsevier Interactive Patient Education  2018 [employer]vier Inc.  

## 2018-10-06 ENCOUNTER — Other Ambulatory Visit: Payer: Self-pay | Admitting: Physician Assistant

## 2018-10-06 DIAGNOSIS — E785 Hyperlipidemia, unspecified: Secondary | ICD-10-CM

## 2018-10-31 ENCOUNTER — Ambulatory Visit: Payer: Medicare Other | Admitting: Emergency Medicine

## 2018-11-08 ENCOUNTER — Ambulatory Visit: Payer: Medicare Other | Admitting: Physician Assistant

## 2019-04-03 ENCOUNTER — Other Ambulatory Visit (INDEPENDENT_AMBULATORY_CARE_PROVIDER_SITE_OTHER): Payer: Self-pay | Admitting: Family Medicine

## 2019-04-16 ENCOUNTER — Other Ambulatory Visit: Payer: Self-pay | Admitting: Family Medicine

## 2019-04-16 MED ORDER — TRAMADOL HCL 50 MG PO TABS: 50.0000 mg | ORAL_TABLET | Freq: Two times a day (BID) | ORAL | 0 refills | Status: DC | PRN

## 2019-05-13 ENCOUNTER — Other Ambulatory Visit: Payer: Self-pay | Admitting: Physician Assistant

## 2019-05-13 DIAGNOSIS — G8929 Other chronic pain: Secondary | ICD-10-CM

## 2019-05-13 NOTE — Telephone Encounter (Signed)
Please advise if Dr. Mitchel Honour would like to take over rx

## 2019-05-26 ENCOUNTER — Telehealth: Payer: Self-pay | Admitting: Emergency Medicine

## 2019-05-26 NOTE — Telephone Encounter (Signed)
Pt wants to know if you can call in Medication meloxicam (MOBIC) 15 MG tablet [20580] meloxicam (MOBIC) 15 MG tablet     Pt has appt 07.15.2020

## 2019-06-04 ENCOUNTER — Other Ambulatory Visit: Payer: Self-pay

## 2019-06-04 ENCOUNTER — Ambulatory Visit (INDEPENDENT_AMBULATORY_CARE_PROVIDER_SITE_OTHER): Payer: 59 | Admitting: Emergency Medicine

## 2019-06-04 ENCOUNTER — Encounter: Payer: Self-pay | Admitting: Emergency Medicine

## 2019-06-04 VITALS — BP 135/76 | HR 74 | Temp 98.5°F | Resp 16 | Ht 68.0 in | Wt 192.0 lb

## 2019-06-04 DIAGNOSIS — I10 Essential (primary) hypertension: Secondary | ICD-10-CM

## 2019-06-04 DIAGNOSIS — E785 Hyperlipidemia, unspecified: Secondary | ICD-10-CM | POA: Diagnosis not present

## 2019-06-04 DIAGNOSIS — G8929 Other chronic pain: Secondary | ICD-10-CM

## 2019-06-04 DIAGNOSIS — M2550 Pain in unspecified joint: Secondary | ICD-10-CM | POA: Diagnosis not present

## 2019-06-04 MED ORDER — ATORVASTATIN CALCIUM 20 MG PO TABS: 20.0000 mg | ORAL_TABLET | Freq: Every day | ORAL | 3 refills | Status: DC

## 2019-06-04 MED ORDER — LISINOPRIL-HYDROCHLOROTHIAZIDE 20-25 MG PO TABS: 1.0000 | ORAL_TABLET | Freq: Every day | ORAL | 3 refills | Status: DC

## 2019-06-04 MED ORDER — AMLODIPINE BESYLATE 5 MG PO TABS: 5.0000 mg | ORAL_TABLET | Freq: Every day | ORAL | 3 refills | Status: DC

## 2019-06-04 MED ORDER — TRAMADOL HCL 50 MG PO TABS: 50.0000 mg | ORAL_TABLET | Freq: Two times a day (BID) | ORAL | 1 refills | Status: DC | PRN

## 2019-06-04 NOTE — Progress Notes (Signed)
Derek Carroll 58 y.o.   Chief Complaint  Patient presents with  . Medication Refill    all on list   BP Readings from Last 3 Encounters:  06/04/19 135/76  10/03/18 117/71  09/20/18 138/78    HISTORY OF PRESENT ILLNESS: This is a 58 y.o. male with several chronic medical problems here for follow-up and medication refill.  Patient has the following chronic medical problems: 1.  Essential hypertension: On Norvasc and Zestoretic.  Doing well.  No complaints. 2.  Dyslipidemia: On Lipitor 20 mg a day. 3.  Chronic joint pains: On meloxicam and as needed tramadol. 4.  Erectile dysfunction: On sildenafil.  Working well. Has no complaints or medical concerns today.  HPI   Prior to Admission medications   Medication Sig Start Date End Date Taking? Authorizing Provider  acetaminophen (TYLENOL) 500 MG tablet Take 1,000 mg by mouth every 6 (six) hours as needed.   Yes [provider]  amLODipine (NORVASC) 5 MG tablet Take 1 tablet (5 mg total) by mouth daily. 09/20/18  Yes Leonie Douglas, PA-C  aspirin EC 81 MG tablet Take 1 tablet (81 mg total) by mouth daily. 02/08/17  Yes Tereasa Coop, PA-C  atorvastatin (LIPITOR) 20 MG tablet TAKE 1 TABLET BY MOUTH ONCE DAILY 10/09/18  Yes Carlisle Torgeson, Ines Bloomer, MD  lisinopril-hydrochlorothiazide (PRINZIDE,ZESTORETIC) 20-25 MG tablet Take 1 tablet by mouth daily. 05/31/18  Yes Tereasa Coop, PA-C  meloxicam (MOBIC) 15 MG tablet TAKE 1/2 TO 1 TABLET BY MOUTH ONCE DAILY 09/20/18  Yes Tenna Delaine D, PA-C  Multiple Vitamin (MULTIVITAMIN) tablet Take 1 tablet by mouth daily.   Yes [provider]  sildenafil (VIAGRA) 100 MG tablet Take 0.5-1 tablets (50-100 mg total) by mouth daily as needed for erectile dysfunction. 10/03/18  Yes Ruhaan Nordahl, Ines Bloomer, MD  traMADol (ULTRAM) 50 MG tablet Take 1 tablet (50 mg total) by mouth 2 (two) times daily. 09/26/18  Yes Hilts, Legrand Como, MD  traMADol (ULTRAM) 50 MG tablet Take 1 tablet (50 mg  total) by mouth 2 (two) times daily as needed. 04/16/19  Yes Hilts, Legrand Como, MD    No Known Allergies  Patient Active Problem List   Diagnosis Date Noted  . Erectile dysfunction 10/03/2018  . Pre-diabetes 02/27/2018  . Hypertriglyceridemia 02/08/2017  . Dyslipidemia 02/08/2017  . Essential hypertension 12/26/2016  . Chronic joint pain 08/02/2016    Past Medical History:  Diagnosis Date  . Back pain     Past Surgical History:  Procedure Laterality Date  . LEG SURGERY      Social History   Socioeconomic History  . Marital status: Single    Spouse name: Not on file  . Number of children: Not on file  . Years of education: Not on file  . Highest education level: Not on file  Occupational History  . Not on file  Social Needs  . Financial resource strain: Not on file  . Food insecurity    Worry: Not on file    Inability: Not on file  . Transportation needs    Medical: Not on file    Non-medical: Not on file  Tobacco Use  . Smoking status: Current Every Day Smoker    Packs/day: 0.75    Years: 36.00    Pack years: 27.00    Types: Cigarettes  . Smokeless tobacco: Never Used  Substance and Sexual Activity  . Alcohol use: No    Comment: Quit January 2016  . Drug use: No  .  Sexual activity: Never  Lifestyle  . Physical activity    Days per week: Not on file    Minutes per session: Not on file  . Stress: Not on file  Relationships  . Social Herbalist on phone: Not on file    Gets together: Not on file    Attends religious service: Not on file    Active member of club or organization: Not on file    Attends meetings of clubs or organizations: Not on file    Relationship status: Not on file  . Intimate partner violence    Fear of current or ex partner: Not on file    Emotionally abused: Not on file    Physically abused: Not on file    Forced sexual activity: Not on file  Other Topics Concern  . Not on file  Social History Narrative  . Not on file     History reviewed. No pertinent family history.   Review of Systems  Constitutional: Negative.  Negative for chills and fever.  HENT: Negative.  Negative for congestion and sore throat.   Eyes: Negative.   Respiratory: Negative.  Negative for cough and shortness of breath.   Cardiovascular: Negative.  Negative for chest pain and palpitations.  Gastrointestinal: Negative.  Negative for abdominal pain, blood in stool, diarrhea, melena, nausea and vomiting.  Genitourinary: Negative.  Negative for dysuria.  Musculoskeletal: Negative.   Skin: Negative.  Negative for rash.  Neurological: Negative.  Negative for dizziness and headaches.  Endo/Heme/Allergies: Negative.   All other systems reviewed and are negative.   Vitals:   06/04/19 1049  BP: 135/76  Pulse: 74  Resp: 16  Temp: 98.5 F (36.9 C)  SpO2: 95%    Physical Exam Vitals signs reviewed.  Constitutional:      Appearance: Normal appearance.  HENT:     Head: Normocephalic and atraumatic.  Eyes:     Extraocular Movements: Extraocular movements intact.     Conjunctiva/sclera: Conjunctivae normal.     Pupils: Pupils are equal, round, and reactive to light.  Neck:     Musculoskeletal: Normal range of motion and neck supple.  Cardiovascular:     Rate and Rhythm: Normal rate and regular rhythm.     Pulses: Normal pulses.     Heart sounds: Normal heart sounds.  Pulmonary:     Effort: Pulmonary effort is normal.     Breath sounds: Normal breath sounds.  Abdominal:     Palpations: Abdomen is soft.     Tenderness: There is no abdominal tenderness.  Musculoskeletal: Normal range of motion.  Skin:    General: Skin is warm and dry.     Capillary Refill: Capillary refill takes less than 2 seconds.  Neurological:     General: No focal deficit present.     Mental Status: He is alert and oriented to person, place, and time.     Sensory: No sensory deficit.     Motor: No weakness.  Psychiatric:        Mood and Affect:  Mood normal.        Behavior: Behavior normal.      ASSESSMENT & PLAN: [name] was seen today for medication refill.  Diagnoses and all orders for this visit:  Essential hypertension -     lisinopril-hydrochlorothiazide (ZESTORETIC) 20-25 MG tablet; Take 1 tablet by mouth daily. -     amLODipine (NORVASC) 5 MG tablet; Take 1 tablet (5 mg total) by mouth daily.  Dyslipidemia -     atorvastatin (LIPITOR) 20 MG tablet; Take 1 tablet (20 mg total) by mouth daily.  Chronic joint pain  Other orders -     traMADol (ULTRAM) 50 MG tablet; Take 1 tablet (50 mg total) by mouth 2 (two) times daily as needed.   Clinically stable.  No medical concerns identified during this visit.  Continue present medications.  No changes.  Follow-up in 6 months.    Patient Instructions       If you have lab work done today you will be contacted with your lab results within the next 2 weeks.  If you have not heard from [place] then please contact us. The fastest way to get your results is to register for My Chart.   IF you received an x-ray today, you will receive an invoice from [facility]. Please contact [facility] at [phone] with questions or concerns regarding your invoice.   IF you received labwork today, you will receive an invoice from [facility]. Please contact [facility] at [phone] with questions or concerns regarding your invoice.   Our billing staff will not be able to assist you with questions regarding bills from these companies.  You will be contacted with the lab results as soon as they are available. The fastest way to get your results is to activate your My Chart account. Instructions are located on the last page of this paperwork. If you have not heard from [place] regarding the results in 2 weeks, please contact this office.      Hypertension, Adult High blood pressure (hypertension) is when the force of blood pumping through the arteries is too strong. The  arteries are the blood vessels that carry blood from the heart throughout the body. Hypertension forces the heart to work harder to pump blood and may cause arteries to become narrow or stiff. Untreated or uncontrolled hypertension can cause a heart attack, heart failure, a stroke, kidney disease, and other problems. A blood pressure reading consists of a higher number over a lower number. Ideally, your blood pressure should be below 120/80. The first ("top") number is called the systolic pressure. It is a measure of the pressure in your arteries as your heart beats. The second ("bottom") number is called the diastolic pressure. It is a measure of the pressure in your arteries as the heart relaxes. What are the causes? The exact cause of this condition is not known. There are some conditions that result in or are related to high blood pressure. What increases the risk? Some risk factors for high blood pressure are under your control. The following factors may make you more likely to develop this condition:  Smoking.  Having type 2 diabetes mellitus, high cholesterol, or both.  Not getting enough exercise or physical activity.  Being overweight.  Having too much fat, sugar, calories, or salt (sodium) in your diet.  Drinking too much alcohol. Some risk factors for high blood pressure may be difficult or impossible to change. Some of these factors include:  Having chronic kidney disease.  Having a family history of high blood pressure.  Age. Risk increases with age.  Race. You may be at higher risk if you are African American.  Gender. Men are at higher risk than women before age 35. After age 53, women are at higher risk than men.  Having obstructive sleep apnea.  Stress. What are the signs or symptoms? High blood pressure may not cause symptoms. Very high blood pressure (hypertensive crisis)  may cause:  Headache.  Anxiety.  Shortness of breath.  Nosebleed.  Nausea and  vomiting.  Vision changes.  Severe chest pain.  Seizures. How is this diagnosed? This condition is diagnosed by measuring your blood pressure while you are seated, with your arm resting on a flat surface, your legs uncrossed, and your feet flat on the floor. The cuff of the blood pressure monitor will be placed directly against the skin of your upper arm at the level of your heart. It should be measured at least twice using the same arm. Certain conditions can cause a difference in blood pressure between your right and left arms. Certain factors can cause blood pressure readings to be lower or higher than normal for a short period of time:  When your blood pressure is higher when you are in a health care provider's office than when you are at home, this is called white coat hypertension. Most people with this condition do not need medicines.  When your blood pressure is higher at home than when you are in a health care provider's office, this is called masked hypertension. Most people with this condition may need medicines to control blood pressure. If you have a high blood pressure reading during one visit or you have normal blood pressure with other risk factors, you may be asked to:  Return on a different day to have your blood pressure checked again.  Monitor your blood pressure at home for 1 week or longer. If you are diagnosed with hypertension, you may have other blood or imaging tests to help your health care provider understand your overall risk for other conditions. How is this treated? This condition is treated by making healthy lifestyle changes, such as eating healthy foods, exercising more, and reducing your alcohol intake. Your health care provider may prescribe medicine if lifestyle changes are not enough to get your blood pressure under control, and if:  Your systolic blood pressure is above 130.  Your diastolic blood pressure is above 80. Your personal target blood  pressure may vary depending on your medical conditions, your age, and other factors. Follow these instructions at home: Eating and drinking   Eat a diet that is high in fiber and potassium, and low in sodium, added sugar, and fat. An example eating plan is called the DASH (Dietary Approaches to Stop Hypertension) diet. To eat this way: ? Eat plenty of fresh fruits and vegetables. Try to fill one half of your plate at each meal with fruits and vegetables. ? Eat whole grains, such as whole-wheat pasta, brown rice, or whole-grain bread. Fill about one fourth of your plate with whole grains. ? Eat or drink low-fat dairy products, such as skim milk or low-fat yogurt. ? Avoid fatty cuts of meat, processed or cured meats, and poultry with skin. Fill about one fourth of your plate with lean proteins, such as fish, chicken without skin, beans, eggs, or tofu. ? Avoid pre-made and processed foods. These tend to be higher in sodium, added sugar, and fat.  Reduce your daily sodium intake. Most people with hypertension should eat less than 1,500 mg of sodium a day.  Do not drink alcohol if: ? Your health care provider tells you not to drink. ? You are pregnant, may be pregnant, or are planning to become pregnant.  If you drink alcohol: ? Limit how much you use to:  0-1 drink a day for women.  0-2 drinks a day for men. ? Be aware of how  much alcohol is in your drink. In the [place] one drink equals one 12 oz bottle of beer (355 mL), one 5 oz glass of wine (148 mL), or one 1 oz glass of hard liquor (44 mL). Lifestyle   Work with your health care provider to maintain a healthy body weight or to lose weight. Ask what an ideal weight is for you.  Get at least 30 minutes of exercise most days of the week. Activities may include walking, swimming, or biking.  Include exercise to strengthen your muscles (resistance exercise), such as Pilates or lifting weights, as part of your weekly exercise routine. Try  to do these types of exercises for 30 minutes at least 3 days a week.  Do not use any products that contain nicotine or tobacco, such as cigarettes, e-cigarettes, and chewing tobacco. If you need help quitting, ask your health care provider.  Monitor your blood pressure at home as told by your health care provider.  Keep all follow-up visits as told by your health care provider. This is important. Medicines  Take over-the-counter and prescription medicines only as told by your health care provider. Follow directions carefully. Blood pressure medicines must be taken as prescribed.  Do not skip doses of blood pressure medicine. Doing this puts you at risk for problems and can make the medicine less effective.  Ask your health care provider about side effects or reactions to medicines that you should watch for. Contact a health care provider if you:  Think you are having a reaction to a medicine you are taking.  Have headaches that keep coming back (recurring).  Feel dizzy.  Have swelling in your ankles.  Have trouble with your vision. Get help right away if you:  Develop a severe headache or confusion.  Have unusual weakness or numbness.  Feel faint.  Have severe pain in your chest or abdomen.  Vomit repeatedly.  Have trouble breathing. Summary  Hypertension is when the force of blood pumping through your arteries is too strong. If this condition is not controlled, it may put you at risk for serious complications.  Your personal target blood pressure may vary depending on your medical conditions, your age, and other factors. For most people, a normal blood pressure is less than 120/80.  Hypertension is treated with lifestyle changes, medicines, or a combination of both. Lifestyle changes include losing weight, eating a healthy, low-sodium diet, exercising more, and limiting alcohol. This information is not intended to replace advice given to you by your health care  provider. Make sure you discuss any questions you have with your health care provider. Document Released: 11/06/2005 Document Revised: 07/17/2018 Document Reviewed: 07/17/2018 Elsevier Patient Education  2020 [employer]     Agustina Caroli, MD [facility]

## 2019-06-04 NOTE — Patient Instructions (Addendum)
   If you have lab work done today you will be contacted with your lab results within the next 2 weeks.  If you have not heard from us then please contact us. The fastest way to get your results is to register for My Chart.   IF you received an x-ray today, you will receive an invoice from [facility]. Please contact [facility] at [phone] with questions or concerns regarding your invoice.   IF you received labwork today, you will receive an invoice from [facility]. Please contact [facility] at [phone] with questions or concerns regarding your invoice.   Our billing staff will not be able to assist you with questions regarding bills from these companies.  You will be contacted with the lab results as soon as they are available. The fastest way to get your results is to activate your My Chart account. Instructions are located on the last page of this paperwork. If you have not heard from us regarding the results in 2 weeks, please contact this office.     Hypertension, Adult High blood pressure (hypertension) is when the force of blood pumping through the arteries is too strong. The arteries are the blood vessels that carry blood from the heart throughout the body. Hypertension forces the heart to work harder to pump blood and may cause arteries to become narrow or stiff. Untreated or uncontrolled hypertension can cause a heart attack, heart failure, a stroke, kidney disease, and other problems. A blood pressure reading consists of a higher number over a lower number. Ideally, your blood pressure should be below 120/80. The first ("top") number is called the systolic pressure. It is a measure of the pressure in your arteries as your heart beats. The second ("bottom") number is called the diastolic pressure. It is a measure of the pressure in your arteries as the heart relaxes. What are the causes? The exact cause of this condition is not known. There are some conditions  that result in or are related to high blood pressure. What increases the risk? Some risk factors for high blood pressure are under your control. The following factors may make you more likely to develop this condition:  Smoking.  Having type 2 diabetes mellitus, high cholesterol, or both.  Not getting enough exercise or physical activity.  Being overweight.  Having too much fat, sugar, calories, or salt (sodium) in your diet.  Drinking too much alcohol. Some risk factors for high blood pressure may be difficult or impossible to change. Some of these factors include:  Having chronic kidney disease.  Having a family history of high blood pressure.  Age. Risk increases with age.  Race. You may be at higher risk if you are African American.  Gender. Men are at higher risk than women before age 45. After age 65, women are at higher risk than men.  Having obstructive sleep apnea.  Stress. What are the signs or symptoms? High blood pressure may not cause symptoms. Very high blood pressure (hypertensive crisis) may cause:  Headache.  Anxiety.  Shortness of breath.  Nosebleed.  Nausea and vomiting.  Vision changes.  Severe chest pain.  Seizures. How is this diagnosed? This condition is diagnosed by measuring your blood pressure while you are seated, with your arm resting on a flat surface, your legs uncrossed, and your feet flat on the floor. The cuff of the blood pressure monitor will be placed directly against the skin of your upper arm at the level of your heart.   It should be measured at least twice using the same arm. Certain conditions can cause a difference in blood pressure between your right and left arms. Certain factors can cause blood pressure readings to be lower or higher than normal for a short period of time:  When your blood pressure is higher when you are in a health care provider's office than when you are at home, this is called white coat hypertension.  Most people with this condition do not need medicines.  When your blood pressure is higher at home than when you are in a health care provider's office, this is called masked hypertension. Most people with this condition may need medicines to control blood pressure. If you have a high blood pressure reading during one visit or you have normal blood pressure with other risk factors, you may be asked to:  Return on a different day to have your blood pressure checked again.  Monitor your blood pressure at home for 1 week or longer. If you are diagnosed with hypertension, you may have other blood or imaging tests to help your health care provider understand your overall risk for other conditions. How is this treated? This condition is treated by making healthy lifestyle changes, such as eating healthy foods, exercising more, and reducing your alcohol intake. Your health care provider may prescribe medicine if lifestyle changes are not enough to get your blood pressure under control, and if:  Your systolic blood pressure is above 130.  Your diastolic blood pressure is above 80. Your personal target blood pressure may vary depending on your medical conditions, your age, and other factors. Follow these instructions at home: Eating and drinking   Eat a diet that is high in fiber and potassium, and low in sodium, added sugar, and fat. An example eating plan is called the DASH (Dietary Approaches to Stop Hypertension) diet. To eat this way: ? Eat plenty of fresh fruits and vegetables. Try to fill one half of your plate at each meal with fruits and vegetables. ? Eat whole grains, such as whole-wheat pasta, brown rice, or whole-grain bread. Fill about one fourth of your plate with whole grains. ? Eat or drink low-fat dairy products, such as skim milk or low-fat yogurt. ? Avoid fatty cuts of meat, processed or cured meats, and poultry with skin. Fill about one fourth of your plate with lean proteins, such  as fish, chicken without skin, beans, eggs, or tofu. ? Avoid pre-made and processed foods. These tend to be higher in sodium, added sugar, and fat.  Reduce your daily sodium intake. Most people with hypertension should eat less than 1,500 mg of sodium a day.  Do not drink alcohol if: ? Your health care provider tells you not to drink. ? You are pregnant, may be pregnant, or are planning to become pregnant.  If you drink alcohol: ? Limit how much you use to:  0-1 drink a day for women.  0-2 drinks a day for men. ? Be aware of how much alcohol is in your drink. In the [place] one drink equals one 12 oz bottle of beer (355 mL), one 5 oz glass of wine (148 mL), or one 1 oz glass of hard liquor (44 mL). Lifestyle   Work with your health care provider to maintain a healthy body weight or to lose weight. Ask what an ideal weight is for you.  Get at least 30 minutes of exercise most days of the week. Activities may include walking, swimming, or   biking.  Include exercise to strengthen your muscles (resistance exercise), such as Pilates or lifting weights, as part of your weekly exercise routine. Try to do these types of exercises for 30 minutes at least 3 days a week.  Do not use any products that contain nicotine or tobacco, such as cigarettes, e-cigarettes, and chewing tobacco. If you need help quitting, ask your health care provider.  Monitor your blood pressure at home as told by your health care provider.  Keep all follow-up visits as told by your health care provider. This is important. Medicines  Take over-the-counter and prescription medicines only as told by your health care provider. Follow directions carefully. Blood pressure medicines must be taken as prescribed.  Do not skip doses of blood pressure medicine. Doing this puts you at risk for problems and can make the medicine less effective.  Ask your health care provider about side effects or reactions to medicines that you  should watch for. Contact a health care provider if you:  Think you are having a reaction to a medicine you are taking.  Have headaches that keep coming back (recurring).  Feel dizzy.  Have swelling in your ankles.  Have trouble with your vision. Get help right away if you:  Develop a severe headache or confusion.  Have unusual weakness or numbness.  Feel faint.  Have severe pain in your chest or abdomen.  Vomit repeatedly.  Have trouble breathing. Summary  Hypertension is when the force of blood pumping through your arteries is too strong. If this condition is not controlled, it may put you at risk for serious complications.  Your personal target blood pressure may vary depending on your medical conditions, your age, and other factors. For most people, a normal blood pressure is less than 120/80.  Hypertension is treated with lifestyle changes, medicines, or a combination of both. Lifestyle changes include losing weight, eating a healthy, low-sodium diet, exercising more, and limiting alcohol. This information is not intended to replace advice given to you by your health care provider. Make sure you discuss any questions you have with your health care provider. Document Released: 11/06/2005 Document Revised: 07/17/2018 Document Reviewed: 07/17/2018 Elsevier Patient Education  2020 [employer]  

## 2019-06-11 ENCOUNTER — Other Ambulatory Visit: Payer: Self-pay | Admitting: Physician Assistant

## 2019-06-11 DIAGNOSIS — G8929 Other chronic pain: Secondary | ICD-10-CM

## 2019-06-28 ENCOUNTER — Other Ambulatory Visit: Payer: Self-pay

## 2019-06-28 ENCOUNTER — Emergency Department (HOSPITAL_COMMUNITY)
Admission: EM | Admit: 2019-06-28 | Discharge: 2019-06-28 | Disposition: A | Discharge status: Home / Self Care | Payer: 59 | Attending: Emergency Medicine | Admitting: Emergency Medicine

## 2019-06-28 ENCOUNTER — Encounter (HOSPITAL_COMMUNITY): Payer: Self-pay | Admitting: Emergency Medicine

## 2019-06-28 ENCOUNTER — Other Ambulatory Visit: Payer: Self-pay | Admitting: Physician Assistant

## 2019-06-28 DIAGNOSIS — Z03818 Encounter for observation for suspected exposure to other biological agents ruled out: Secondary | ICD-10-CM | POA: Diagnosis not present

## 2019-06-28 DIAGNOSIS — I10 Essential (primary) hypertension: Secondary | ICD-10-CM

## 2019-06-28 DIAGNOSIS — R42 Dizziness and giddiness: Secondary | ICD-10-CM | POA: Diagnosis not present

## 2019-06-28 DIAGNOSIS — Z79899 Other long term (current) drug therapy: Secondary | ICD-10-CM | POA: Diagnosis not present

## 2019-06-28 DIAGNOSIS — Z20828 Contact with and (suspected) exposure to other viral communicable diseases: Secondary | ICD-10-CM | POA: Diagnosis not present

## 2019-06-28 DIAGNOSIS — F17210 Nicotine dependence, cigarettes, uncomplicated: Secondary | ICD-10-CM | POA: Insufficient documentation

## 2019-06-28 DIAGNOSIS — Z7982 Long term (current) use of aspirin: Secondary | ICD-10-CM | POA: Diagnosis not present

## 2019-06-28 HISTORY — DX: Essential (primary) hypertension: I10

## 2019-06-28 LAB — BASIC METABOLIC PANEL
Anion gap: 10 (ref 5–15)
BUN: 19 mg/dL (ref 6–20)
CO2: 25 mmol/L (ref 22–32)
Calcium: 9.3 mg/dL (ref 8.9–10.3)
Chloride: 102 mmol/L (ref 98–111)
Creatinine, Ser: 1.01 mg/dL (ref 0.61–1.24)
GFR calc Af Amer: >60 mL/min (ref >60)
GFR calc non Af Amer: >60 mL/min (ref >60)
Glucose, Bld: 102 mg/dL — ABNORMAL HIGH (ref 70–99)
Potassium: 3.7 mmol/L (ref 3.5–5.1)
Sodium: 137 mmol/L (ref 135–145)

## 2019-06-28 LAB — URINALYSIS, ROUTINE W REFLEX MICROSCOPIC
Bilirubin Urine: NEGATIVE
Glucose, UA: NEGATIVE mg/dL
Hgb urine dipstick: NEGATIVE
Ketones, ur: NEGATIVE mg/dL
Leukocytes,Ua: NEGATIVE
Nitrite: NEGATIVE
Protein, ur: NEGATIVE mg/dL
Specific Gravity, Urine: 1.01 (ref 1.005–1.030)
pH: 7 (ref 5.0–8.0)

## 2019-06-28 LAB — CBC
HCT: 44.3 % (ref 39.0–52.0)
Hemoglobin: 14.9 g/dL (ref 13.0–17.0)
MCH: 32.5 pg (ref 26.0–34.0)
MCHC: 33.6 g/dL (ref 30.0–36.0)
MCV: 96.7 fL (ref 80.0–100.0)
Platelets: 285 10*3/uL (ref 150–400)
RBC: 4.58 MIL/uL (ref 4.22–5.81)
RDW: 11.9 % (ref 11.5–15.5)
WBC: 7 10*3/uL (ref 4.0–10.5)
nRBC: 0 % (ref 0.0–0.2)

## 2019-06-28 MED ORDER — SODIUM CHLORIDE 0.9% FLUSH: 3.0000 mL | Freq: Once | INTRAVENOUS | Status: DC

## 2019-06-28 MED ORDER — MECLIZINE HCL 25 MG PO TABS: 25.0000 mg | ORAL_TABLET | Freq: Three times a day (TID) | ORAL | 0 refills | Status: DC | PRN

## 2019-06-28 NOTE — ED Provider Notes (Addendum)
[facility] EMERGENCY DEPARTMENT Provider Note   CSN: [id number] Arrival date & time: 06/28/19  1554     History   Chief Complaint Chief Complaint  Patient presents with  . Dizziness    HPI Derek Carroll is a 58 y.o. male presenting to the ED complaining of a few episodes of dizziness over the past 4 days.  Patient reports he was at work on Wednesday working underneath a truck when he began to feel dizzy like things were spinning around him.  He reports he did have another episode yesterday when he was on a ladder working on the top of a truck.  He reports the episodes lasted for a few minutes at a time and resolved on their own.  He is concerned that he could have COVID-19 because he read dizziness could be a symptom.  He denies any history of vertigo.  He has not tried any medications prior to arrival.  He denies any fever, chills, cough, sick contacts, abdominal pain, nausea, vomiting, diarrhea, or any other complaints.     HPI  Past Medical History:  Diagnosis Date  . Back pain   . Hypertension     Patient Active Problem List   Diagnosis Date Noted  . Erectile dysfunction 10/03/2018  . Pre-diabetes 02/27/2018  . Hypertriglyceridemia 02/08/2017  . Dyslipidemia 02/08/2017  . Essential hypertension 12/26/2016  . Chronic joint pain 08/02/2016    Past Surgical History:  Procedure Laterality Date  . LEG SURGERY          Home Medications    Prior to Admission medications   Medication Sig Start Date End Date Taking? Authorizing Provider  acetaminophen (TYLENOL) 500 MG tablet Take 1,000 mg by mouth every 6 (six) hours as needed.    [provider]  amLODipine (NORVASC) 5 MG tablet Take 1 tablet (5 mg total) by mouth daily. 06/04/19   Horald Pollen, MD  aspirin EC 81 MG tablet Take 1 tablet (81 mg total) by mouth daily. 02/08/17   Tereasa Coop, PA-C  atorvastatin (LIPITOR) 20 MG tablet Take 1 tablet (20 mg total) by mouth daily.  06/04/19   Horald Pollen, MD  lisinopril-hydrochlorothiazide (ZESTORETIC) 20-25 MG tablet Take 1 tablet by mouth daily. 06/04/19   Horald Pollen, MD  meclizine (ANTIVERT) 25 MG tablet Take 1 tablet (25 mg total) by mouth 3 (three) times daily as needed for dizziness. 06/28/19   Candie Chroman, MD  meloxicam (MOBIC) 15 MG tablet TAKE 1/2 TO 1 TABLET BY MOUTH EVERY DAY 06/11/19   Horald Pollen, MD  Multiple Vitamin (MULTIVITAMIN) tablet Take 1 tablet by mouth daily.    [provider]  sildenafil (VIAGRA) 100 MG tablet Take 0.5-1 tablets (50-100 mg total) by mouth daily as needed for erectile dysfunction. 10/03/18   Horald Pollen, MD  traMADol (ULTRAM) 50 MG tablet Take 1 tablet (50 mg total) by mouth 2 (two) times daily. 09/26/18   Hilts, Legrand Como, MD  traMADol (ULTRAM) 50 MG tablet Take 1 tablet (50 mg total) by mouth 2 (two) times daily as needed. 06/04/19   Horald Pollen, MD    Family History No family history on file.  Social History Social History   Tobacco Use  . Smoking status: Current Every Day Smoker    Packs/day: 0.75    Years: 36.00    Pack years: 27.00    Types: Cigarettes  . Smokeless tobacco: Never Used  Substance Use Topics  . Alcohol  use: No    Comment: Quit January 2016  . Drug use: No     Allergies   Patient has no known allergies.   Review of Systems Review of Systems  Constitutional: Negative for chills and fever.  HENT: Negative for ear pain, hearing loss and sore throat.   Eyes: Negative for pain and visual disturbance.  Respiratory: Negative for cough and shortness of breath.   Cardiovascular: Negative for chest pain and palpitations.  Gastrointestinal: Negative for abdominal pain and vomiting.  Genitourinary: Negative for dysuria and hematuria.  Musculoskeletal: Negative for arthralgias and back pain.  Skin: Negative for color change and rash.  Neurological: Positive for dizziness. Negative for seizures,  syncope, light-headedness, numbness and headaches.  All other systems reviewed and are negative.    Physical Exam Updated Vital Signs BP 131/83   Pulse 66   Temp 98.4 F (36.9 C) (Oral)   Resp (!) 21   SpO2 96%   Physical Exam Vitals signs and nursing note reviewed.  Constitutional:      General: He is not in acute distress.    Appearance: Normal appearance. He is normal weight. He is not ill-appearing, toxic-appearing or diaphoretic.  HENT:     Head: Normocephalic and atraumatic.     Right Ear: Tympanic membrane, ear canal and external ear normal.     Left Ear: Tympanic membrane, ear canal and external ear normal.     Nose: Nose normal. No congestion or rhinorrhea.     Mouth/Throat:     Mouth: Mucous membranes are moist.     Pharynx: Oropharynx is clear. No oropharyngeal exudate or posterior oropharyngeal erythema.  Eyes:     Extraocular Movements: Extraocular movements intact.     Conjunctiva/sclera: Conjunctivae normal.     Pupils: Pupils are equal, round, and reactive to light.  Neck:     Musculoskeletal: Normal range of motion and neck supple. No neck rigidity or muscular tenderness.  Cardiovascular:     Rate and Rhythm: Normal rate and regular rhythm.     Pulses: Normal pulses.     Heart sounds: Normal heart sounds. No murmur. No friction rub. No gallop.   Pulmonary:     Effort: Pulmonary effort is normal. No respiratory distress.     Breath sounds: Normal breath sounds. No stridor. No wheezing, rhonchi or rales.  Chest:     Chest wall: No tenderness.  Abdominal:     General: Abdomen is flat. There is no distension.     Palpations: Abdomen is soft.     Tenderness: There is no abdominal tenderness. There is no guarding or rebound.  Musculoskeletal: Normal range of motion.        General: No swelling, tenderness, deformity or signs of injury.  Skin:    General: Skin is warm and dry.  Neurological:     General: No focal deficit present.     Mental Status: He is  alert and oriented to person, place, and time. Mental status is at baseline.     Cranial Nerves: No cranial nerve deficit.     Sensory: No sensory deficit.     Motor: No weakness.     Coordination: Coordination normal.     Gait: Gait normal.  Psychiatric:        Mood and Affect: Mood normal.        Behavior: Behavior normal.     ED Treatments / Results  Labs (all labs ordered are listed, but only abnormal results are displayed) Labs  Reviewed  BASIC METABOLIC PANEL - Abnormal; Notable for the following components:      Result Value   Glucose, Bld 102 (*)    All other components within normal limits  URINALYSIS, ROUTINE W REFLEX MICROSCOPIC - Abnormal; Notable for the following components:   Color, Urine STRAW (*)    All other components within normal limits  SARS CORONAVIRUS 2  CBC    EKG EKG Interpretation  Date/Time:  Saturday June 28 2019 16:06:12 EDT Ventricular Rate:  63 PR Interval:  144 QRS Duration: 88 QT Interval:  406 QTC Calculation: 415 R Axis:   61 Text Interpretation:  Sinus rhythm with occasional Premature ventricular complexes Possible Left atrial enlargement Borderline ECG Confirmed by Davonna Belling [phone]) on 06/28/2019 4:47:18 PM   Radiology No results found.  Procedures Procedures (including critical care time)  Medications Ordered in ED Medications - No data to display   Initial Impression / Assessment and Plan / ED Course  I have reviewed the triage vital signs and the nursing notes.  Pertinent labs & imaging results that were available during my care of the patient were reviewed by me and considered in my medical decision making (see chart for details).        Nik Gorrell is a 58 y.o. male presenting to the ED complaining of a few episodes of dizziness over the past 4 days.  Patient's description of the episodes sound consistent with vertigo.  The symptoms are intermittent and do not correspond with any other neurologic symptoms.   Patient has no focal neurologic findings on physical exam and appears well.  Patient's dizziness is favored to be peripheral in nature.  BMP, CBC, and UA were unremarkable.  EKG showed sinus rhythm with one PVC and without any acute ischemic changes.  Outpatient COVID-19 swab was obtained.  Patient was given a prescription for meclizine and was given the number to follow-up with ENT if he has persistence of his dizziness.  He was advised to return to the ED for any new or worsening symptoms.  Final Clinical Impressions(s) / ED Diagnoses   Final diagnoses:  Vertigo    ED Discharge Orders         Ordered    meclizine (ANTIVERT) 25 MG tablet  3 times daily PRN     06/28/19 1658            Candie Chroman, MD 06/28/19 2227    Davonna Belling, MD 06/29/19 1245

## 2019-06-28 NOTE — ED Notes (Signed)
Patient verbalizes understanding of discharge instructions. Opportunity for questioning and answers were provided. Pt discharged from ED. 

## 2019-06-28 NOTE — ED Triage Notes (Signed)
Pt reports dizziness since Wednesday. Pt also reports headache. Pt denies pain currently. Pt reports some shortness of breath. He denies sick contact

## 2019-06-29 LAB — SARS CORONAVIRUS 2 (TAT 6-24 HRS): SARS Coronavirus 2: NEGATIVE

## 2019-08-11 ENCOUNTER — Other Ambulatory Visit: Payer: Self-pay | Admitting: Emergency Medicine

## 2019-08-11 DIAGNOSIS — N529 Male erectile dysfunction, unspecified: Secondary | ICD-10-CM

## 2019-08-11 MED ORDER — SILDENAFIL CITRATE 100 MG PO TABS: 50.0000 mg | ORAL_TABLET | Freq: Every day | ORAL | 11 refills | Status: DC | PRN

## 2019-09-08 ENCOUNTER — Other Ambulatory Visit: Payer: Self-pay | Admitting: Emergency Medicine

## 2019-09-08 NOTE — Telephone Encounter (Signed)
Requested medication (s) are due for refill today  yes  Requested medication (s) are on the active medication list: {Yes  Last refill: 06/04/2019  #30  1 refill  Future visit scheduled no  Notes to clinic Not delegated  Requested Prescriptions  Pending Prescriptions Disp Refills   traMADol (ULTRAM) 50 MG tablet [Pharmacy Med Name: TRAMADOL 50MG  TABLETS] 30 tablet     Sig: TAKE 1 TABLET(50 MG) BY MOUTH TWICE DAILY AS NEEDED     Not Delegated - Analgesics:  Opioid Agonists Failed - 09/08/2019  5:45 PM      Failed - This refill cannot be delegated      Failed - Urine Drug Screen completed in last 360 days.      Passed - Valid encounter within last 6 months    Recent Outpatient Visits          3 months ago Essential hypertension   Primary Care at [facility], [place], [place]   11 months ago Erectile dysfunction, unspecified erectile dysfunction type   Primary Care at [facility], Ines Bloomer, MD   11 months ago Essential hypertension   Primary Care at [place], [place] D, PA-C   1 year ago Dyslipidemia   [facility] at [facility], Audrie Lia, PA-C   2 years ago Essential hypertension   Primary Care at [place], PA-C

## 2019-10-27 ENCOUNTER — Other Ambulatory Visit: Payer: Self-pay | Admitting: Emergency Medicine

## 2019-10-28 NOTE — Telephone Encounter (Signed)
Requested medication (s) are due for refill today: yes  Requested medication (s) are on the active medication list: yes  Last refill:  09/11/2019  Future visit scheduled: no  Notes to clinic:  Refill cannot be delegated    Requested Prescriptions  Pending Prescriptions Disp Refills   traMADol (ULTRAM) 50 MG tablet [Pharmacy Med Name: TRAMADOL 50MG  TABLETS] 30 tablet     Sig: TAKE 1 TABLET(50 MG) BY MOUTH DAILY AS NEEDED FOR SEVERE PAIN     Not Delegated - Analgesics:  Opioid Agonists Failed - 10/27/2019  8:13 PM      Failed - This refill cannot be delegated      Failed - Urine Drug Screen completed in last 360 days.      Passed - Valid encounter within last 6 months    Recent Outpatient Visits          4 months ago Essential hypertension   Primary Care at [place], Ines Bloomer, MD   1 year ago Erectile dysfunction, unspecified erectile dysfunction type   Primary Care at [facility], Ines Bloomer, MD   1 year ago Essential hypertension   Primary Care at [place], [place] D, PA-C   1 year ago Dyslipidemia   [facility] at [facility], Audrie Lia, PA-C   2 years ago Essential hypertension   Primary Care at [place], PA-C

## 2019-11-04 NOTE — Telephone Encounter (Signed)
Patient is requesting a refill of the following medications: Requested Prescriptions   Pending Prescriptions Disp Refills   traMADol (ULTRAM) 50 MG tablet [Pharmacy Med Name: TRAMADOL 50MG  TABLETS] 30 tablet     Sig: TAKE 1 TABLET(50 MG) BY MOUTH DAILY AS NEEDED FOR SEVERE PAIN    Date of patient request: 10/27/19 Last office visit: 06/04/19 Date of last refill: 09/11/19 Last refill amount: 30 Follow up time period per chart:  none

## 2019-12-21 ENCOUNTER — Other Ambulatory Visit: Payer: Self-pay | Admitting: Emergency Medicine

## 2019-12-21 NOTE — Telephone Encounter (Signed)
Requested medication (s) are due for refill today: yes  Requested medication (s) are on the active medication list: yes  Last refill:  11/04/2019  Future visit scheduled: no  Notes to clinic: this refill cannot be delegated Overdue for office visit    Requested Prescriptions  Pending Prescriptions Disp Refills   traMADol (ULTRAM) 50 MG tablet [Pharmacy Med Name: TRAMADOL 50MG  TABLETS] 30 tablet     Sig: TAKE 1 TABLET(50 MG) BY MOUTH DAILY AS NEEDED FOR SEVERE PAIN      Not Delegated - Analgesics:  Opioid Agonists Failed - 12/21/2019  4:48 PM      Failed - This refill cannot be delegated      Failed - Urine Drug Screen completed in last 360 days.      Failed - Valid encounter within last 6 months    Recent Outpatient Visits           6 months ago Essential hypertension   Primary Care at [facility], Ines Bloomer, MD   1 year ago Erectile dysfunction, unspecified erectile dysfunction type   Primary Care at [facility], Ines Bloomer, MD   1 year ago Essential hypertension   Primary Care at [place], [place] D, PA-C   1 year ago Dyslipidemia   [facility] Care at [facility], Audrie Lia, PA-C   2 years ago Essential hypertension   Primary Care at [place], PA-C

## 2019-12-22 NOTE — Telephone Encounter (Signed)
Please schedule appt for refills

## 2019-12-23 ENCOUNTER — Telehealth: Payer: Self-pay | Admitting: Emergency Medicine

## 2019-12-23 NOTE — Telephone Encounter (Signed)
Called pt, no vm set up

## 2020-03-16 ENCOUNTER — Encounter: Payer: Self-pay | Admitting: *Deleted

## 2020-05-14 ENCOUNTER — Other Ambulatory Visit: Payer: Self-pay

## 2020-05-14 ENCOUNTER — Ambulatory Visit: Payer: Self-pay

## 2020-05-14 ENCOUNTER — Other Ambulatory Visit: Payer: Self-pay | Admitting: Occupational Medicine

## 2020-05-14 DIAGNOSIS — M25572 Pain in left ankle and joints of left foot: Secondary | ICD-10-CM

## 2020-06-05 ENCOUNTER — Other Ambulatory Visit: Payer: Self-pay | Admitting: Emergency Medicine

## 2020-06-05 DIAGNOSIS — E785 Hyperlipidemia, unspecified: Secondary | ICD-10-CM

## 2020-06-05 DIAGNOSIS — I10 Essential (primary) hypertension: Secondary | ICD-10-CM

## 2020-06-05 NOTE — Telephone Encounter (Signed)
Requested medication (s) are due for refill today: yes  Requested medication (s) are on the active medication list: yes  Last refill:  06/04/19 for all 3 requested refills  Future visit scheduled: no  Notes to clinic:  [facility] no longer makes appts for this practice.   Requested Prescriptions  Pending Prescriptions Disp Refills   atorvastatin (LIPITOR) 20 MG tablet [Pharmacy Med Name: ATORVASTATIN 20MG  TABLETS] 90 tablet 3    Sig: TAKE 1 TABLET(20 MG) BY MOUTH DAILY      Cardiovascular:  Antilipid - Statins Failed - 06/05/2020  3:02 PM      Failed - Total Cholesterol in normal range and within 360 days    Cholesterol, Total  Date Value Ref Range Status  05/31/2018 91 (L) 100 - 199 mg/dL Final          Failed - LDL in normal range and within 360 days    LDL Calculated  Date Value Ref Range Status  05/31/2018 22 0 - 99 mg/dL Final          Failed - HDL in normal range and within 360 days    HDL  Date Value Ref Range Status  05/31/2018 29 (L) >39 mg/dL Final          Failed - Triglycerides in normal range and within 360 days    Triglycerides  Date Value Ref Range Status  05/31/2018 198 (H) 0 - 149 mg/dL Final          Failed - Valid encounter within last 12 months    Recent Outpatient Visits           1 year ago Essential hypertension   [facility] at [facility], Ines Bloomer, MD   1 year ago Erectile dysfunction, unspecified erectile dysfunction type   Primary Care at [facility], Ines Bloomer, MD   1 year ago Essential hypertension   Primary [facility] at [place], [place] D, PA-C   2 years ago Dyslipidemia   [facility] at [facility], Audrie Lia, PA-C   2 years ago Essential hypertension   [facility] at [place], Audrie Lia, PA-C              Passed - Patient is not pregnant        amLODipine ([place]) 5 MG tablet [Pharmacy Med Name: AMLODIPINE BESYLATE 5MG  TABLETS] 90 tablet 3    Sig: TAKE 1 TABLET(5 MG) BY MOUTH DAILY       Cardiovascular:  Calcium Channel Blockers Failed - 06/05/2020  3:02 PM      Failed - Valid encounter within last 6 months    Recent Outpatient Visits           1 year ago Essential hypertension   [facility] at [place], Ines Bloomer, MD   1 year ago Erectile dysfunction, unspecified erectile dysfunction type   Primary Care at [facility], Ines Bloomer, MD   1 year ago Essential hypertension   Primary [facility] at [place], [place] D, PA-C   2 years ago Dyslipidemia   [facility] at [facility], Audrie Lia, PA-C   2 years ago Essential hypertension   Primary Care at [facility], Audrie Lia, PA-C              Passed - Last BP in normal range    BP Readings from Last 1 Encounters:  06/28/19 131/83            lisinopril-hydrochlorothiazide (ZESTORETIC) 20-25 MG tablet [Pharmacy  Med Name: LISINOPRIL-HCTZ 20/25MG  TABLETS] 90 tablet 3    Sig: Take 1 tablet by mouth daily.      Cardiovascular:  ACEI + Diuretic Combos Failed - 06/05/2020  3:02 PM      Failed - Na in normal range and within 180 days    Sodium  Date Value Ref Range Status  06/28/2019 137 135 - 145 mmol/L Final  09/20/2018 142 134 - 144 mmol/L Final          Failed - K in normal range and within 180 days    Potassium  Date Value Ref Range Status  06/28/2019 3.7 3.5 - 5.1 mmol/L Final          Failed - Cr in normal range and within 180 days    Creatinine, Ser  Date Value Ref Range Status  06/28/2019 1.01 0.61 - 1.24 mg/dL Final          Failed - Ca in normal range and within 180 days    Calcium  Date Value Ref Range Status  06/28/2019 9.3 8.9 - 10.3 mg/dL Final          Failed - Valid encounter within last 6 months    Recent Outpatient Visits           1 year ago Essential hypertension   [facility] at [place], Ines Bloomer, MD   1 year ago Erectile dysfunction, unspecified erectile dysfunction type   Primary Care at [facility], Ines Bloomer, MD   1 year ago Essential  hypertension   Primary Care at [place], [place] D, PA-C   2 years ago Dyslipidemia   [facility] at [facility], Audrie Lia, PA-C   2 years ago Essential hypertension   Primary Care at [place], Audrie Lia, [place]              Passed - Patient is not pregnant      Passed - Last BP in normal range    BP Readings from Last 1 Encounters:  06/28/19 131/83

## 2020-06-07 NOTE — Telephone Encounter (Signed)
No further refills without office visit 

## 2020-06-07 NOTE — Telephone Encounter (Signed)
Please schedule patient a f/u appt and refills

## 2020-06-08 ENCOUNTER — Ambulatory Visit (INDEPENDENT_AMBULATORY_CARE_PROVIDER_SITE_OTHER): Payer: 59 | Admitting: Emergency Medicine

## 2020-06-08 ENCOUNTER — Encounter: Payer: Self-pay | Admitting: Emergency Medicine

## 2020-06-08 ENCOUNTER — Other Ambulatory Visit: Payer: Self-pay

## 2020-06-08 VITALS — BP 131/78 | HR 60 | Temp 98.1°F | Resp 16 | Ht 70.0 in | Wt 199.0 lb

## 2020-06-08 DIAGNOSIS — M2550 Pain in unspecified joint: Secondary | ICD-10-CM | POA: Diagnosis not present

## 2020-06-08 DIAGNOSIS — E785 Hyperlipidemia, unspecified: Secondary | ICD-10-CM

## 2020-06-08 DIAGNOSIS — M549 Dorsalgia, unspecified: Secondary | ICD-10-CM

## 2020-06-08 DIAGNOSIS — I10 Essential (primary) hypertension: Secondary | ICD-10-CM

## 2020-06-08 DIAGNOSIS — Z1211 Encounter for screening for malignant neoplasm of colon: Secondary | ICD-10-CM

## 2020-06-08 DIAGNOSIS — N529 Male erectile dysfunction, unspecified: Secondary | ICD-10-CM

## 2020-06-08 DIAGNOSIS — G8929 Other chronic pain: Secondary | ICD-10-CM

## 2020-06-08 MED ORDER — LISINOPRIL-HYDROCHLOROTHIAZIDE 20-25 MG PO TABS: 1.0000 | ORAL_TABLET | Freq: Every day | ORAL | 3 refills | Status: DC

## 2020-06-08 MED ORDER — ATORVASTATIN CALCIUM 20 MG PO TABS: ORAL_TABLET | ORAL | 3 refills | Status: DC

## 2020-06-08 MED ORDER — MECLIZINE HCL 25 MG PO TABS: 25.0000 mg | ORAL_TABLET | Freq: Three times a day (TID) | ORAL | 3 refills | Status: DC | PRN

## 2020-06-08 MED ORDER — MELOXICAM 15 MG PO TABS: ORAL_TABLET | ORAL | 3 refills | Status: DC

## 2020-06-08 MED ORDER — AMLODIPINE BESYLATE 5 MG PO TABS: ORAL_TABLET | ORAL | 0 refills | Status: DC

## 2020-06-08 MED ORDER — SILDENAFIL CITRATE 100 MG PO TABS: 50.0000 mg | ORAL_TABLET | Freq: Every day | ORAL | 11 refills | Status: DC | PRN

## 2020-06-08 NOTE — Assessment & Plan Note (Signed)
Well-controlled hypertension. Continue present medications. No changes. Diet and nutrition discussed. Follow-up in 1 year.

## 2020-06-08 NOTE — Progress Notes (Addendum)
Derek Carroll 59 y.o.   Chief Complaint  Patient presents with  . Medication Refill    PEND    HISTORY OF PRESENT ILLNESS: This is a 59 y.o. male with several chronic medical problems here for follow-up and medication refill.  Patient has the following chronic medical problems: 1.  Essential hypertension: On Norvasc and Zestoretic.  Doing well.  No complaints. Has lost some weight. Wt Readings from Last 3 Encounters:  06/08/20 199 lb (90.3 kg)  06/04/19 192 lb (87.1 kg)  10/03/18 208 lb 9.6 oz (94.6 kg)   BP Readings from Last 3 Encounters:  06/08/20 131/78  06/28/19 131/83  06/04/19 135/76    2.  Dyslipidemia: On Lipitor 20 mg a day. 3.  Chronic joint pains: On meloxicam and as needed tramadol. 4.  Erectile dysfunction: On sildenafil.  Working well. Has no complaints or medical concerns today. Fully vaccinated against Covid.  HPI   Prior to Admission medications   Medication Sig Start Date End Date Taking? Authorizing Provider  acetaminophen (TYLENOL) 500 MG tablet Take 1,000 mg by mouth every 6 (six) hours as needed.   Yes [provider]  amLODipine (NORVASC) 5 MG tablet TAKE 1 TABLET(5 MG) BY MOUTH DAILY 06/07/20  Yes Keyari Kleeman, Ines Bloomer, MD  aspirin EC 81 MG tablet Take 1 tablet (81 mg total) by mouth daily. 02/08/17  Yes Tereasa Coop, PA-C  atorvastatin (LIPITOR) 20 MG tablet TAKE 1 TABLET(20 MG) BY MOUTH DAILY 06/07/20  Yes Ashden Sonnenberg, Ines Bloomer, MD  lisinopril-hydrochlorothiazide (ZESTORETIC) 20-25 MG tablet TAKE 1 TABLET BY MOUTH DAILY 06/07/20  Yes Cia Garretson, Ines Bloomer, MD  meclizine (ANTIVERT) 25 MG tablet Take 1 tablet (25 mg total) by mouth 3 (three) times daily as needed for dizziness. 06/28/19  Yes Candie Chroman, MD  meloxicam (MOBIC) 15 MG tablet TAKE 1/2 TO 1 TABLET BY MOUTH EVERY DAY 06/11/19  Yes Ikeya Brockel, Ines Bloomer, MD  Multiple Vitamin (MULTIVITAMIN) tablet Take 1 tablet by mouth daily.   Yes [provider]  sildenafil (VIAGRA) 100  MG tablet Take 0.5-1 tablets (50-100 mg total) by mouth daily as needed for erectile dysfunction. 08/11/19  Yes Terrie Grajales, Ines Bloomer, MD  traMADol (ULTRAM) 50 MG tablet Take 1 tablet (50 mg total) by mouth 2 (two) times daily. Patient not taking: Reported on 06/08/2020 09/26/18   Hilts, Legrand Como, MD  traMADol (ULTRAM) 50 MG tablet TAKE 1 TABLET(50 MG) BY MOUTH DAILY AS NEEDED FOR SEVERE PAIN Patient not taking: Reported on 06/08/2020 11/04/19   Horald Pollen, MD    No Known Allergies  Patient Active Problem List   Diagnosis Date Noted  . Erectile dysfunction 10/03/2018  . Pre-diabetes 02/27/2018  . Hypertriglyceridemia 02/08/2017  . Dyslipidemia 02/08/2017  . Essential hypertension 12/26/2016  . Chronic joint pain 08/02/2016    Past Medical History:  Diagnosis Date  . Back pain   . Hypertension     Past Surgical History:  Procedure Laterality Date  . LEG SURGERY      Social History   Socioeconomic History  . Marital status: Single    Spouse name: Not on file  . Number of children: Not on file  . Years of education: Not on file  . Highest education level: Not on file  Occupational History  . Not on file  Tobacco Use  . Smoking status: Current Every Day Smoker    Packs/day: 0.75    Years: 36.00    Pack years: 27.00    Types: Cigarettes  .  Smokeless tobacco: Never Used  Substance and Sexual Activity  . Alcohol use: No    Comment: Quit January 2016  . Drug use: No  . Sexual activity: Never  Other Topics Concern  . Not on file  Social History Narrative  . Not on file   Social Determinants of Health   Financial Resource Strain:   . Difficulty of Paying Living Expenses:   Food Insecurity:   . Worried About Charity fundraiser in the Last Year:   . Arboriculturist in the Last Year:   Transportation Needs:   . Film/video editor (Medical):   Marland Kitchen Lack of Transportation (Non-Medical):   Physical Activity:   . Days of Exercise per Week:   . Minutes of  Exercise per Session:   Stress:   . Feeling of Stress :   Social Connections:   . Frequency of Communication with Friends and Family:   . Frequency of Social Gatherings with Friends and Family:   . Attends Religious Services:   . Active Member of Clubs or Organizations:   . Attends Archivist Meetings:   Marland Kitchen Marital Status:   Intimate Partner Violence:   . Fear of Current or Ex-Partner:   . Emotionally Abused:   Marland Kitchen Physically Abused:   . Sexually Abused:     History reviewed. No pertinent family history.   Review of Systems  Constitutional: Negative.  Negative for chills and fever.  HENT: Negative.  Negative for congestion and sore throat.   Respiratory: Negative.  Negative for cough and shortness of breath.   Cardiovascular: Negative.  Negative for chest pain and palpitations.  Gastrointestinal: Negative.  Negative for abdominal pain, diarrhea, nausea and vomiting.  Genitourinary: Negative.  Negative for dysuria and hematuria.  Musculoskeletal: Negative.  Negative for back pain, myalgias and neck pain.  Skin: Negative.  Negative for rash.  Neurological: Negative.  Negative for dizziness and headaches.  Endo/Heme/Allergies: Negative.   All other systems reviewed and are negative.   Today's Vitals   06/08/20 1114  BP: 131/78  Pulse: 60  Resp: 16  Temp: 98.1 F (36.7 C)  TempSrc: Temporal  SpO2: 95%  Weight: 199 lb (90.3 kg)  Height: 5\' 10"  (1.778 m)   Body mass index is 28.55 kg/m.  Physical Exam Vitals reviewed.  Constitutional:      Appearance: Normal appearance.  HENT:     Head: Normocephalic.  Eyes:     Extraocular Movements: Extraocular movements intact.     Pupils: Pupils are equal, round, and reactive to light.  Cardiovascular:     Rate and Rhythm: Normal rate and regular rhythm.     Pulses: Normal pulses.     Heart sounds: Normal heart sounds.  Pulmonary:     Effort: Pulmonary effort is normal.     Breath sounds: Normal breath sounds.    Musculoskeletal:        General: Normal range of motion.     Cervical back: Normal range of motion and neck supple.  Skin:    General: Skin is warm and dry.     Capillary Refill: Capillary refill takes less than 2 seconds.  Neurological:     General: No focal deficit present.     Mental Status: He is alert and oriented to person, place, and time.  Psychiatric:        Mood and Affect: Mood normal.     A total of 30 minutes was spent with the patient, greater than  50% of which was in counseling/coordination of care regarding hypertension and cardiovascular risks associated with this condition, review of all medications, review of most recent office visit notes, review of most recent blood work results, diet and nutrition, prognosis and need for follow-up.  ASSESSMENT & PLAN: Essential hypertension Well-controlled hypertension. Continue present medications. No changes. Diet and nutrition discussed. Follow-up in 1 year.  Almon was seen today for medication refill.  Diagnoses and all orders for this visit:  Essential hypertension -     Comprehensive metabolic panel -     CBC with Differential/Platelet -     amLODipine (NORVASC) 5 MG tablet; TAKE 1 TABLET(5 MG) BY MOUTH DAILY -     lisinopril-hydrochlorothiazide (ZESTORETIC) 20-25 MG tablet; Take 1 tablet by mouth daily.  Dyslipidemia -     Lipid panel -     CBC with Differential/Platelet -     atorvastatin (LIPITOR) 20 MG tablet; TAKE 1 TABLET(20 MG) BY MOUTH DAILY  Chronic joint pain -     meloxicam (MOBIC) 15 MG tablet; TAKE 1/2 TO 1 TABLET BY MOUTH EVERY DAY  Chronic back pain, unspecified back location, unspecified back pain laterality -     meloxicam (MOBIC) 15 MG tablet; TAKE 1/2 TO 1 TABLET BY MOUTH EVERY DAY  Erectile dysfunction, unspecified erectile dysfunction type -     sildenafil (VIAGRA) 100 MG tablet; Take 0.5-1 tablets (50-100 mg total) by mouth daily as needed for erectile dysfunction.  Colon cancer  screening -     Ambulatory referral to Gastroenterology  Other orders -     meclizine (ANTIVERT) 25 MG tablet; Take 1 tablet (25 mg total) by mouth 3 (three) times daily as needed for dizziness.     Patient Instructions       If you have lab work done today you will be contacted with your lab results within the next 2 weeks.  If you have not heard from [place] then please contact us. The fastest way to get your results is to register for My Chart.   IF you received an x-ray today, you will receive an invoice from [facility]. Please contact [facility] at [phone] with questions or concerns regarding your invoice.   IF you received labwork today, you will receive an invoice from [facility]. Please contact [facility] at [phone] with questions or concerns regarding your invoice.   Our billing staff will not be able to assist you with questions regarding bills from these companies.  You will be contacted with the lab results as soon as they are available. The fastest way to get your results is to activate your My Chart account. Instructions are located on the last page of this paperwork. If you have not heard from [place] regarding the results in 2 weeks, please contact this office.     Hypertension, Adult High blood pressure (hypertension) is when the force of blood pumping through the arteries is too strong. The arteries are the blood vessels that carry blood from the heart throughout the body. Hypertension forces the heart to work harder to pump blood and may cause arteries to become narrow or stiff. Untreated or uncontrolled hypertension can cause a heart attack, heart failure, a stroke, kidney disease, and other problems. A blood pressure reading consists of a higher number over a lower number. Ideally, your blood pressure should be below 120/80. The first ("top") number is called the systolic pressure. It is a measure of the pressure in your arteries as your heart beats.  The second ("bottom") number is called the diastolic pressure. It is a measure of the pressure in your arteries as the heart relaxes. What are the causes? The exact cause of this condition is not known. There are some conditions that result in or are related to high blood pressure. What increases the risk? Some risk factors for high blood pressure are under your control. The following factors may make you more likely to develop this condition:  Smoking.  Having type 2 diabetes mellitus, high cholesterol, or both.  Not getting enough exercise or physical activity.  Being overweight.  Having too much fat, sugar, calories, or salt (sodium) in your diet.  Drinking too much alcohol. Some risk factors for high blood pressure may be difficult or impossible to change. Some of these factors include:  Having chronic kidney disease.  Having a family history of high blood pressure.  Age. Risk increases with age.  Race. You may be at higher risk if you are African American.  Gender. Men are at higher risk than women before age 75. After age 63, women are at higher risk than men.  Having obstructive sleep apnea.  Stress. What are the signs or symptoms? High blood pressure may not cause symptoms. Very high blood pressure (hypertensive crisis) may cause:  Headache.  Anxiety.  Shortness of breath.  Nosebleed.  Nausea and vomiting.  Vision changes.  Severe chest pain.  Seizures. How is this diagnosed? This condition is diagnosed by measuring your blood pressure while you are seated, with your arm resting on a flat surface, your legs uncrossed, and your feet flat on the floor. The cuff of the blood pressure monitor will be placed directly against the skin of your upper arm at the level of your heart. It should be measured at least twice using the same arm. Certain conditions can cause a difference in blood pressure between your right and left arms. Certain factors can cause blood  pressure readings to be lower or higher than normal for a short period of time:  When your blood pressure is higher when you are in a health care provider's office than when you are at home, this is called white coat hypertension. Most people with this condition do not need medicines.  When your blood pressure is higher at home than when you are in a health care provider's office, this is called masked hypertension. Most people with this condition may need medicines to control blood pressure. If you have a high blood pressure reading during one visit or you have normal blood pressure with other risk factors, you may be asked to:  Return on a different day to have your blood pressure checked again.  Monitor your blood pressure at home for 1 week or longer. If you are diagnosed with hypertension, you may have other blood or imaging tests to help your health care provider understand your overall risk for other conditions. How is this treated? This condition is treated by making healthy lifestyle changes, such as eating healthy foods, exercising more, and reducing your alcohol intake. Your health care provider may prescribe medicine if lifestyle changes are not enough to get your blood pressure under control, and if:  Your systolic blood pressure is above 130.  Your diastolic blood pressure is above 80. Your personal target blood pressure may vary depending on your medical conditions, your age, and other factors. Follow these instructions at home: Eating and drinking   Eat a diet that is high in fiber and potassium,  and low in sodium, added sugar, and fat. An example eating plan is called the DASH (Dietary Approaches to Stop Hypertension) diet. To eat this way: ? Eat plenty of fresh fruits and vegetables. Try to fill one half of your plate at each meal with fruits and vegetables. ? Eat whole grains, such as whole-wheat pasta, brown rice, or whole-grain bread. Fill about one fourth of your plate  with whole grains. ? Eat or drink low-fat dairy products, such as skim milk or low-fat yogurt. ? Avoid fatty cuts of meat, processed or cured meats, and poultry with skin. Fill about one fourth of your plate with lean proteins, such as fish, chicken without skin, beans, eggs, or tofu. ? Avoid pre-made and processed foods. These tend to be higher in sodium, added sugar, and fat.  Reduce your daily sodium intake. Most people with hypertension should eat less than 1,500 mg of sodium a day.  Do not drink alcohol if: ? Your health care provider tells you not to drink. ? You are pregnant, may be pregnant, or are planning to become pregnant.  If you drink alcohol: ? Limit how much you use to:  0-1 drink a day for women.  0-2 drinks a day for men. ? Be aware of how much alcohol is in your drink. In the [place] one drink equals one 12 oz bottle of beer (355 mL), one 5 oz glass of wine (148 mL), or one 1 oz glass of hard liquor (44 mL). Lifestyle   Work with your health care provider to maintain a healthy body weight or to lose weight. Ask what an ideal weight is for you.  Get at least 30 minutes of exercise most days of the week. Activities may include walking, swimming, or biking.  Include exercise to strengthen your muscles (resistance exercise), such as Pilates or lifting weights, as part of your weekly exercise routine. Try to do these types of exercises for 30 minutes at least 3 days a week.  Do not use any products that contain nicotine or tobacco, such as cigarettes, e-cigarettes, and chewing tobacco. If you need help quitting, ask your health care provider.  Monitor your blood pressure at home as told by your health care provider.  Keep all follow-up visits as told by your health care provider. This is important. Medicines  Take over-the-counter and prescription medicines only as told by your health care provider. Follow directions carefully. Blood pressure medicines must be taken as  prescribed.  Do not skip doses of blood pressure medicine. Doing this puts you at risk for problems and can make the medicine less effective.  Ask your health care provider about side effects or reactions to medicines that you should watch for. Contact a health care provider if you:  Think you are having a reaction to a medicine you are taking.  Have headaches that keep coming back (recurring).  Feel dizzy.  Have swelling in your ankles.  Have trouble with your vision. Get help right away if you:  Develop a severe headache or confusion.  Have unusual weakness or numbness.  Feel faint.  Have severe pain in your chest or abdomen.  Vomit repeatedly.  Have trouble breathing. Summary  Hypertension is when the force of blood pumping through your arteries is too strong. If this condition is not controlled, it may put you at risk for serious complications.  Your personal target blood pressure may vary depending on your medical conditions, your age, and other factors. For most people,  a normal blood pressure is less than 120/80.  Hypertension is treated with lifestyle changes, medicines, or a combination of both. Lifestyle changes include losing weight, eating a healthy, low-sodium diet, exercising more, and limiting alcohol. This information is not intended to replace advice given to you by your health care provider. Make sure you discuss any questions you have with your health care provider. Document Revised: 07/17/2018 Document Reviewed: 07/17/2018 Elsevier Patient Education  2020 [employer]      Agustina Caroli, MD [facility]

## 2020-06-08 NOTE — Patient Instructions (Addendum)
   If you have lab work done today you will be contacted with your lab results within the next 2 weeks.  If you have not heard from us then please contact us. The fastest way to get your results is to register for My Chart.   IF you received an x-ray today, you will receive an invoice from [facility]. Please contact [facility] at [phone] with questions or concerns regarding your invoice.   IF you received labwork today, you will receive an invoice from [facility]. Please contact [facility] at [phone] with questions or concerns regarding your invoice.   Our billing staff will not be able to assist you with questions regarding bills from these companies.  You will be contacted with the lab results as soon as they are available. The fastest way to get your results is to activate your My Chart account. Instructions are located on the last page of this paperwork. If you have not heard from us regarding the results in 2 weeks, please contact this office.      Hypertension, Adult High blood pressure (hypertension) is when the force of blood pumping through the arteries is too strong. The arteries are the blood vessels that carry blood from the heart throughout the body. Hypertension forces the heart to work harder to pump blood and may cause arteries to become narrow or stiff. Untreated or uncontrolled hypertension can cause a heart attack, heart failure, a stroke, kidney disease, and other problems. A blood pressure reading consists of a higher number over a lower number. Ideally, your blood pressure should be below 120/80. The first ("top") number is called the systolic pressure. It is a measure of the pressure in your arteries as your heart beats. The second ("bottom") number is called the diastolic pressure. It is a measure of the pressure in your arteries as the heart relaxes. What are the causes? The exact cause of this condition is not known. There are some conditions  that result in or are related to high blood pressure. What increases the risk? Some risk factors for high blood pressure are under your control. The following factors may make you more likely to develop this condition:  Smoking.  Having type 2 diabetes mellitus, high cholesterol, or both.  Not getting enough exercise or physical activity.  Being overweight.  Having too much fat, sugar, calories, or salt (sodium) in your diet.  Drinking too much alcohol. Some risk factors for high blood pressure may be difficult or impossible to change. Some of these factors include:  Having chronic kidney disease.  Having a family history of high blood pressure.  Age. Risk increases with age.  Race. You may be at higher risk if you are African American.  Gender. Men are at higher risk than women before age 45. After age 65, women are at higher risk than men.  Having obstructive sleep apnea.  Stress. What are the signs or symptoms? High blood pressure may not cause symptoms. Very high blood pressure (hypertensive crisis) may cause:  Headache.  Anxiety.  Shortness of breath.  Nosebleed.  Nausea and vomiting.  Vision changes.  Severe chest pain.  Seizures. How is this diagnosed? This condition is diagnosed by measuring your blood pressure while you are seated, with your arm resting on a flat surface, your legs uncrossed, and your feet flat on the floor. The cuff of the blood pressure monitor will be placed directly against the skin of your upper arm at the level of your   heart. It should be measured at least twice using the same arm. Certain conditions can cause a difference in blood pressure between your right and left arms. Certain factors can cause blood pressure readings to be lower or higher than normal for a short period of time:  When your blood pressure is higher when you are in a health care provider's office than when you are at home, this is called white coat hypertension.  Most people with this condition do not need medicines.  When your blood pressure is higher at home than when you are in a health care provider's office, this is called masked hypertension. Most people with this condition may need medicines to control blood pressure. If you have a high blood pressure reading during one visit or you have normal blood pressure with other risk factors, you may be asked to:  Return on a different day to have your blood pressure checked again.  Monitor your blood pressure at home for 1 week or longer. If you are diagnosed with hypertension, you may have other blood or imaging tests to help your health care provider understand your overall risk for other conditions. How is this treated? This condition is treated by making healthy lifestyle changes, such as eating healthy foods, exercising more, and reducing your alcohol intake. Your health care provider may prescribe medicine if lifestyle changes are not enough to get your blood pressure under control, and if:  Your systolic blood pressure is above 130.  Your diastolic blood pressure is above 80. Your personal target blood pressure may vary depending on your medical conditions, your age, and other factors. Follow these instructions at home: Eating and drinking   Eat a diet that is high in fiber and potassium, and low in sodium, added sugar, and fat. An example eating plan is called the DASH (Dietary Approaches to Stop Hypertension) diet. To eat this way: ? Eat plenty of fresh fruits and vegetables. Try to fill one half of your plate at each meal with fruits and vegetables. ? Eat whole grains, such as whole-wheat pasta, brown rice, or whole-grain bread. Fill about one fourth of your plate with whole grains. ? Eat or drink low-fat dairy products, such as skim milk or low-fat yogurt. ? Avoid fatty cuts of meat, processed or cured meats, and poultry with skin. Fill about one fourth of your plate with lean proteins, such  as fish, chicken without skin, beans, eggs, or tofu. ? Avoid pre-made and processed foods. These tend to be higher in sodium, added sugar, and fat.  Reduce your daily sodium intake. Most people with hypertension should eat less than 1,500 mg of sodium a day.  Do not drink alcohol if: ? Your health care provider tells you not to drink. ? You are pregnant, may be pregnant, or are planning to become pregnant.  If you drink alcohol: ? Limit how much you use to:  0-1 drink a day for women.  0-2 drinks a day for men. ? Be aware of how much alcohol is in your drink. In the [place] one drink equals one 12 oz bottle of beer (355 mL), one 5 oz glass of wine (148 mL), or one 1 oz glass of hard liquor (44 mL). Lifestyle   Work with your health care provider to maintain a healthy body weight or to lose weight. Ask what an ideal weight is for you.  Get at least 30 minutes of exercise most days of the week. Activities may include walking, swimming,   or biking.  Include exercise to strengthen your muscles (resistance exercise), such as Pilates or lifting weights, as part of your weekly exercise routine. Try to do these types of exercises for 30 minutes at least 3 days a week.  Do not use any products that contain nicotine or tobacco, such as cigarettes, e-cigarettes, and chewing tobacco. If you need help quitting, ask your health care provider.  Monitor your blood pressure at home as told by your health care provider.  Keep all follow-up visits as told by your health care provider. This is important. Medicines  Take over-the-counter and prescription medicines only as told by your health care provider. Follow directions carefully. Blood pressure medicines must be taken as prescribed.  Do not skip doses of blood pressure medicine. Doing this puts you at risk for problems and can make the medicine less effective.  Ask your health care provider about side effects or reactions to medicines that you  should watch for. Contact a health care provider if you:  Think you are having a reaction to a medicine you are taking.  Have headaches that keep coming back (recurring).  Feel dizzy.  Have swelling in your ankles.  Have trouble with your vision. Get help right away if you:  Develop a severe headache or confusion.  Have unusual weakness or numbness.  Feel faint.  Have severe pain in your chest or abdomen.  Vomit repeatedly.  Have trouble breathing. Summary  Hypertension is when the force of blood pumping through your arteries is too strong. If this condition is not controlled, it may put you at risk for serious complications.  Your personal target blood pressure may vary depending on your medical conditions, your age, and other factors. For most people, a normal blood pressure is less than 120/80.  Hypertension is treated with lifestyle changes, medicines, or a combination of both. Lifestyle changes include losing weight, eating a healthy, low-sodium diet, exercising more, and limiting alcohol. This information is not intended to replace advice given to you by your health care provider. Make sure you discuss any questions you have with your health care provider. Document Revised: 07/17/2018 Document Reviewed: 07/17/2018 Elsevier Patient Education  2020 [employer]  

## 2020-06-09 LAB — CBC WITH DIFFERENTIAL/PLATELET
Basophils Absolute: 0.1 10*3/uL (ref 0.0–0.2)
Basos: 1 %
EOS (ABSOLUTE): 0.4 10*3/uL (ref 0.0–0.4)
Eos: 5 %
Hematocrit: 45.8 % (ref 37.5–51.0)
Hemoglobin: 15.2 g/dL (ref 13.0–17.7)
Immature Grans (Abs): 0 10*3/uL (ref 0.0–0.1)
Immature Granulocytes: 0 %
Lymphocytes Absolute: 1.8 10*3/uL (ref 0.7–3.1)
Lymphs: 25 %
MCH: 32.6 pg (ref 26.6–33.0)
MCHC: 33.2 g/dL (ref 31.5–35.7)
MCV: 98 fL — ABNORMAL HIGH (ref 79–97)
Monocytes Absolute: 0.6 10*3/uL (ref 0.1–0.9)
Monocytes: 8 %
Neutrophils Absolute: 4.4 10*3/uL (ref 1.4–7.0)
Neutrophils: 61 %
Platelets: 286 10*3/uL (ref 150–450)
RBC: 4.66 x10E6/uL (ref 4.14–5.80)
RDW: 11.8 % (ref 11.6–15.4)
WBC: 7.3 10*3/uL (ref 3.4–10.8)

## 2020-06-09 LAB — COMPREHENSIVE METABOLIC PANEL
ALT: 21 IU/L (ref 0–44)
AST: 16 IU/L (ref 0–40)
Albumin/Globulin Ratio: 1.6 (ref 1.2–2.2)
Albumin: 4.6 g/dL (ref 3.8–4.9)
Alkaline Phosphatase: 70 IU/L (ref 48–121)
BUN/Creatinine Ratio: 20 (ref 10–24)
BUN: 19 mg/dL (ref 8–27)
Bilirubin Total: 0.7 mg/dL (ref 0.0–1.2)
CO2: 27 mmol/L (ref 20–29)
Calcium: 9.8 mg/dL (ref 8.6–10.2)
Chloride: 101 mmol/L (ref 96–106)
Creatinine, Ser: 0.95 mg/dL (ref 0.76–1.27)
GFR calc Af Amer: 100 mL/min/{1.73_m2} (ref >59)
GFR calc non Af Amer: 87 mL/min/{1.73_m2} (ref >59)
Globulin, Total: 2.9 g/dL (ref 1.5–4.5)
Glucose: 106 mg/dL — ABNORMAL HIGH (ref 65–99)
Potassium: 5.1 mmol/L (ref 3.5–5.2)
Sodium: 140 mmol/L (ref 134–144)
Total Protein: 7.5 g/dL (ref 6.0–8.5)

## 2020-06-09 LAB — LIPID PANEL
Chol/HDL Ratio: 2.7 ratio (ref 0.0–5.0)
Cholesterol, Total: 105 mg/dL (ref 100–199)
HDL: 39 mg/dL — ABNORMAL LOW (ref >39)
LDL Chol Calc (NIH): 38 mg/dL (ref 0–99)
Triglycerides: 172 mg/dL — ABNORMAL HIGH (ref 0–149)
VLDL Cholesterol Cal: 28 mg/dL (ref 5–40)

## 2020-06-10 ENCOUNTER — Telehealth: Payer: Self-pay | Admitting: *Deleted

## 2020-06-10 NOTE — Telephone Encounter (Signed)
Schedule awv  

## 2020-08-24 ENCOUNTER — Other Ambulatory Visit: Payer: Self-pay | Admitting: Emergency Medicine

## 2020-08-24 DIAGNOSIS — I10 Essential (primary) hypertension: Secondary | ICD-10-CM

## 2021-01-04 ENCOUNTER — Other Ambulatory Visit: Payer: Self-pay | Admitting: Emergency Medicine

## 2021-01-04 DIAGNOSIS — I10 Essential (primary) hypertension: Secondary | ICD-10-CM

## 2021-01-04 NOTE — Telephone Encounter (Signed)
Requested medication (s) are due for refill today: yes   Requested medication (s) are on the active medication list:  yes   Last refill:  11/24/2020  Future visit scheduled: no  Notes to clinic: overdue for follow up appointment    Requested Prescriptions  Pending Prescriptions Disp Refills   amLODipine (NORVASC) 5 MG tablet [Pharmacy Med Name: AMLODIPINE BESYLATE 5MG  TABLETS] 30 tablet 2    Sig: TAKE 1 TABLET(5 MG) BY MOUTH DAILY      Cardiovascular:  Calcium Channel Blockers Failed - 01/04/2021 12:42 PM      Failed - Valid encounter within last 6 months    Recent Outpatient Visits           7 months ago Essential hypertension   [facility] [place], Ines Bloomer, MD   1 year ago Essential hypertension   [facility] [place], Ines Bloomer, MD   2 years ago Erectile dysfunction, unspecified erectile dysfunction type   [facility], Ines Bloomer, MD   2 years ago Essential hypertension   [facility] [place], [place] D, PA-C   2 years ago Dyslipidemia   [facility] [place], PA-C                Passed - Last BP in normal range    BP Readings from Last 1 Encounters:  06/08/20 131/78

## 2021-03-03 NOTE — Telephone Encounter (Signed)
Opened in error

## 2021-09-05 ENCOUNTER — Other Ambulatory Visit: Payer: Self-pay

## 2021-09-06 ENCOUNTER — Encounter (HOSPITAL_COMMUNITY): Payer: Self-pay | Admitting: *Deleted

## 2021-09-06 ENCOUNTER — Emergency Department (HOSPITAL_COMMUNITY)
Admission: EM | Admit: 2021-09-06 | Discharge: 2021-09-06 | Disposition: A | Discharge status: Home / Self Care | Payer: 59 | Attending: Emergency Medicine | Admitting: Emergency Medicine

## 2021-09-06 ENCOUNTER — Ambulatory Visit (HOSPITAL_COMMUNITY)
Admission: EM | Admit: 2021-09-06 | Discharge: 2021-09-06 | Disposition: A | Discharge status: Home / Self Care | Payer: 59

## 2021-09-06 ENCOUNTER — Other Ambulatory Visit: Payer: Self-pay

## 2021-09-06 DIAGNOSIS — I161 Hypertensive emergency: Secondary | ICD-10-CM | POA: Diagnosis not present

## 2021-09-06 DIAGNOSIS — I10 Essential (primary) hypertension: Secondary | ICD-10-CM | POA: Diagnosis not present

## 2021-09-06 DIAGNOSIS — R42 Dizziness and giddiness: Secondary | ICD-10-CM

## 2021-09-06 DIAGNOSIS — R519 Headache, unspecified: Secondary | ICD-10-CM | POA: Diagnosis present

## 2021-09-06 DIAGNOSIS — F17210 Nicotine dependence, cigarettes, uncomplicated: Secondary | ICD-10-CM | POA: Insufficient documentation

## 2021-09-06 DIAGNOSIS — G8929 Other chronic pain: Secondary | ICD-10-CM | POA: Diagnosis not present

## 2021-09-06 DIAGNOSIS — Z9189 Other specified personal risk factors, not elsewhere classified: Secondary | ICD-10-CM

## 2021-09-06 DIAGNOSIS — E785 Hyperlipidemia, unspecified: Secondary | ICD-10-CM | POA: Insufficient documentation

## 2021-09-06 DIAGNOSIS — Z79899 Other long term (current) drug therapy: Secondary | ICD-10-CM | POA: Insufficient documentation

## 2021-09-06 DIAGNOSIS — M549 Dorsalgia, unspecified: Secondary | ICD-10-CM | POA: Diagnosis not present

## 2021-09-06 DIAGNOSIS — M2550 Pain in unspecified joint: Secondary | ICD-10-CM

## 2021-09-06 LAB — COMPREHENSIVE METABOLIC PANEL
ALT: 26 U/L (ref 0–44)
AST: 22 U/L (ref 15–41)
Albumin: 3.8 g/dL (ref 3.5–5.0)
Alkaline Phosphatase: 64 U/L (ref 38–126)
Anion gap: 7 (ref 5–15)
BUN: 14 mg/dL (ref 8–23)
CO2: 28 mmol/L (ref 22–32)
Calcium: 9 mg/dL (ref 8.9–10.3)
Chloride: 102 mmol/L (ref 98–111)
Creatinine, Ser: 0.98 mg/dL (ref 0.61–1.24)
GFR, Estimated: >60 mL/min (ref >60)
Glucose, Bld: 91 mg/dL (ref 70–99)
Potassium: 4.3 mmol/L (ref 3.5–5.1)
Sodium: 137 mmol/L (ref 135–145)
Total Bilirubin: 0.7 mg/dL (ref 0.3–1.2)
Total Protein: 6.9 g/dL (ref 6.5–8.1)

## 2021-09-06 LAB — CBC WITH DIFFERENTIAL/PLATELET
Abs Immature Granulocytes: 0.03 10*3/uL (ref 0.00–0.07)
Basophils Absolute: 0.1 10*3/uL (ref 0.0–0.1)
Basophils Relative: 1 %
Eosinophils Absolute: 0.6 10*3/uL — ABNORMAL HIGH (ref 0.0–0.5)
Eosinophils Relative: 6 %
HCT: 46.5 % (ref 39.0–52.0)
Hemoglobin: 15.4 g/dL (ref 13.0–17.0)
Immature Granulocytes: 0 %
Lymphocytes Relative: 20 %
Lymphs Abs: 1.9 10*3/uL (ref 0.7–4.0)
MCH: 32.8 pg (ref 26.0–34.0)
MCHC: 33.1 g/dL (ref 30.0–36.0)
MCV: 99.1 fL (ref 80.0–100.0)
Monocytes Absolute: 0.7 10*3/uL (ref 0.1–1.0)
Monocytes Relative: 8 %
Neutro Abs: 6.2 10*3/uL (ref 1.7–7.7)
Neutrophils Relative %: 65 %
Platelets: 245 10*3/uL (ref 150–400)
RBC: 4.69 MIL/uL (ref 4.22–5.81)
RDW: 11.7 % (ref 11.5–15.5)
WBC: 9.5 10*3/uL (ref 4.0–10.5)
nRBC: 0 % (ref 0.0–0.2)

## 2021-09-06 MED ORDER — LISINOPRIL-HYDROCHLOROTHIAZIDE 20-25 MG PO TABS: 1.0000 | ORAL_TABLET | Freq: Every day | ORAL | 0 refills | Status: DC

## 2021-09-06 MED ORDER — ACETAMINOPHEN 500 MG PO TABS
1000.0000 mg | ORAL_TABLET | Freq: Once | ORAL | Status: AC
  Administered 2021-09-06: 1000 mg via ORAL
  Filled 2021-09-06: qty 2

## 2021-09-06 MED ORDER — AMLODIPINE BESYLATE 5 MG PO TABS: 5.0000 mg | ORAL_TABLET | Freq: Every day | ORAL | 0 refills | Status: DC

## 2021-09-06 MED ORDER — ASPIRIN EC 81 MG PO TBEC: 81.0000 mg | DELAYED_RELEASE_TABLET | Freq: Every day | ORAL | 0 refills | Status: DC

## 2021-09-06 MED ORDER — MELOXICAM 15 MG PO TABS: ORAL_TABLET | ORAL | 3 refills | Status: DC

## 2021-09-06 MED ORDER — ATORVASTATIN CALCIUM 20 MG PO TABS: ORAL_TABLET | ORAL | 3 refills | Status: DC

## 2021-09-06 NOTE — Discharge Instructions (Addendum)
You were evaluated in the Emergency Department and after careful evaluation, we did not find any emergent condition requiring admission or further testing in the hospital.  Your exam/testing today was overall reassuring. Your blood pressure was elevated today but not concerning to the point where you need to be admitted to the hospital.  Recommend that you follow-up with a primary care physician in the area.  Your home blood pressure medications have been reordered.  Please return to the Emergency Department if you experience any worsening of your condition.  Thank you for allowing [place] to be a part of your care.

## 2021-09-06 NOTE — ED Triage Notes (Signed)
Pt presents for refill on BP meds . Pt has been out of meds for several mobnths. Pt reports Ha .

## 2021-09-06 NOTE — Care Management (Signed)
ED RNCM  met with patient  in [facility] concerning ED follow up for a PCP.  Discussed [facility], will send referral to office manager to reach out to establish care. No further ED TOC needs identified.

## 2021-09-06 NOTE — ED Provider Notes (Signed)
[facility] EMERGENCY DEPARTMENT Provider Note   CSN: [id number] Arrival date & time: 09/06/21  1007     History Chief Complaint  Patient presents with   Hypertension   Headache   Dizziness    Derek Carroll is a 60 y.o. male.   Hypertension Associated symptoms include headaches. Pertinent negatives include no chest pain, no abdominal pain and no shortness of breath.  Headache Associated symptoms: no abdominal pain, no back pain, no cough, no ear pain, no eye pain, no fever, no numbness, no seizures, no sore throat and no vomiting    60 year old male presenting to the emergency department from urgent care due to concern for hypertension.  The patient states that he initially presented to urgent care with a complaint of headache and lightheadedness.  He has not been taking his home antihypertensives because he currently does not have a PCP.  He was evaluated at urgent care and found to be hypertensive and was sent to the emergency department to evaluate for possible hypertensive emergency.  He currently states his headache has improved with oral Tylenol.  He denies any blurry vision, seizure, chest pain, shortness of breath, abdominal pain, nausea or vomiting.  Past Medical History:  Diagnosis Date   Back pain    Hypertension     Patient Active Problem List   Diagnosis Date Noted   Erectile dysfunction 10/03/2018   Pre-diabetes 02/27/2018   Hypertriglyceridemia 02/08/2017   Dyslipidemia 02/08/2017   Essential hypertension 12/26/2016   Chronic joint pain 08/02/2016    Past Surgical History:  Procedure Laterality Date   LEG SURGERY         No family history on file.  Social History   Tobacco Use   Smoking status: Every Day    Packs/day: 0.75    Years: 36.00    Pack years: 27.00    Types: Cigarettes   Smokeless tobacco: Never  Substance Use Topics   Alcohol use: No    Comment: Quit January 2016   Drug use: No    Home Medications Prior  to Admission medications   Medication Sig Start Date End Date Taking? Authorizing Provider  amLODipine (NORVASC) 5 MG tablet Take 1 tablet (5 mg total) by mouth daily. 09/06/21 10/06/21 Yes Regan Lemming, MD  lisinopril-hydrochlorothiazide (ZESTORETIC) 20-25 MG tablet Take 1 tablet by mouth daily. 09/06/21 10/06/21 Yes Regan Lemming, MD  aspirin EC 81 MG tablet Take 1 tablet (81 mg total) by mouth daily. 09/06/21   Regan Lemming, MD  atorvastatin (LIPITOR) 20 MG tablet TAKE 1 TABLET(20 MG) BY MOUTH DAILY 09/06/21   Regan Lemming, MD  meloxicam (MOBIC) 15 MG tablet TAKE 1/2 TO 1 TABLET BY MOUTH EVERY DAY 09/06/21   Regan Lemming, MD  Multiple Vitamin (MULTIVITAMIN) tablet Take 1 tablet by mouth daily.    [provider]  sildenafil (VIAGRA) 100 MG tablet Take 0.5-1 tablets (50-100 mg total) by mouth daily as needed for erectile dysfunction. 06/08/20   Horald Pollen, MD    Allergies    Patient has no known allergies.  Review of Systems   Review of Systems  Constitutional:  Negative for chills and fever.  HENT:  Negative for ear pain and sore throat.   Eyes:  Negative for pain and visual disturbance.  Respiratory:  Negative for cough and shortness of breath.   Cardiovascular:  Negative for chest pain and palpitations.  Gastrointestinal:  Negative for abdominal pain and vomiting.  Genitourinary:  Negative for dysuria and hematuria.  Musculoskeletal:  Negative for arthralgias and back pain.  Skin:  Negative for color change and rash.  Neurological:  Positive for light-headedness and headaches. Negative for seizures, syncope, facial asymmetry, speech difficulty and numbness.  All other systems reviewed and are negative.  Physical Exam Updated Vital Signs BP (!) 183/112   Pulse 68   Temp 98.5 F (36.9 C) (Oral)   Resp 16   SpO2 96%   Physical Exam Vitals and nursing note reviewed.  Constitutional:      General: He is not in acute distress.    Appearance: He is  well-developed.  HENT:     Head: Normocephalic and atraumatic.  Eyes:     Conjunctiva/sclera: Conjunctivae normal.     Pupils: Pupils are equal, round, and reactive to light.  Cardiovascular:     Rate and Rhythm: Normal rate and regular rhythm.     Heart sounds: No murmur heard. Pulmonary:     Effort: Pulmonary effort is normal. No respiratory distress.     Breath sounds: Normal breath sounds.  Abdominal:     General: There is no distension.     Palpations: Abdomen is soft.     Tenderness: There is no abdominal tenderness. There is no guarding.  Musculoskeletal:        General: No deformity or signs of injury.     Cervical back: Normal range of motion and neck supple.  Skin:    General: Skin is warm and dry.     Findings: No lesion or rash.  Neurological:     General: No focal deficit present.     Mental Status: He is alert. Mental status is at baseline.     GCS: GCS eye subscore is 4. GCS verbal subscore is 5. GCS motor subscore is 6.     Comments: MENTAL STATUS EXAM:    Orientation: Alert and oriented to person, place and time.  Memory: Cooperative, follows commands well.  Language: Speech is clear and language is normal.   CRANIAL NERVES:    CN 2 (Optic): Visual fields intact to confrontation.  CN 3,4,6 (EOM): Pupils equal and reactive to light. Full extraocular eye movement without nystagmus.  CN 5 (Trigeminal): Facial sensation is normal, no weakness of masticatory muscles.  CN 7 (Facial): No facial weakness or asymmetry.  CN 8 (Auditory): Auditory acuity grossly normal.  CN 9,10 (Glossophar): The uvula is midline, the palate elevates symmetrically.  CN 11 (spinal access): Normal sternocleidomastoid and trapezius strength.  CN 12 (Hypoglossal): The tongue is midline. No atrophy or fasciculations.Marland Kitchen   MOTOR:  Muscle Strength: 5/5RUE, 5/5LUE, 5/5RLE, 5/5LLE.   COORDINATION:   Intact finger-to-nose, no tremor.   SENSATION:   Intact to light touch all four  extremities.  GAIT: Gait normal without ataxia     ED Results / Procedures / Treatments   Labs (all labs ordered are listed, but only abnormal results are displayed) Labs Reviewed  CBC WITH DIFFERENTIAL/PLATELET - Abnormal; Notable for the following components:      Result Value   Eosinophils Absolute 0.6 (*)    All other components within normal limits  COMPREHENSIVE METABOLIC PANEL    EKG EKG Interpretation  Date/Time:  Tuesday September 06 2021 11:18:21 EDT Ventricular Rate:  68 PR Interval:  132 QRS Duration: 86 QT Interval:  380 QTC Calculation: 404 R Axis:   59 Text Interpretation: Normal sinus rhythm Normal ECG Confirmed by Regan Lemming ([id number]) on 09/06/2021 5:18:48 PM  Radiology No results found.  Procedures Procedures  Medications Ordered in ED Medications  acetaminophen (TYLENOL) tablet 1,000 mg (has no administration in time range)    ED Course  I have reviewed the triage vital signs and the nursing notes.  Pertinent labs & imaging results that were available during my care of the patient were reviewed by me and considered in my medical decision making (see chart for details).    MDM Rules/Calculators/A&P                            60 year old male presenting to the emergency department from urgent care due to concern for hypertension.  The patient states that he initially presented to urgent care with a complaint of headache and lightheadedness.  He has not been taking his home antihypertensives because he currently does not have a PCP.  He was evaluated at urgent care and found to be hypertensive and was sent to the emergency department to evaluate for possible hypertensive emergency.  He currently states his headache has improved with oral Tylenol.    On arrival, the patient was afebrile, mildly hypertensive BP 166/92, hemodynamically stable, pulse 82, saturating 95% on room air. Physical exam significant for a normal neurologic exam, lungs that were  clear to auscultation bilaterally, patient resting comfortably in no apparent distress.  Low suspicion for hypertensive emergency, PR ES, ACS, pulmonary edema, PE, CVA at this time.  Patient is otherwise well-appearing with a reassuring physical exam.  He presents with hypertension and headaches.  He states he gets headaches frequently for which he takes Tylenol.  Headache was described as a likely tension type headache.  EKG performed without ischemic changes.  I do not feel that a chest x-ray is warranted at this time as the patient is currently without chest pain, shortness of breath, has normal heart sounds and clear lungs to auscultation bilaterally and reassuring vitals.  Patient is requesting assistance in obtaining a PCP.  Social work consulted.  The patient's home antihypertensives were reordered.  The patient has been appropriately medically screened and/or stabilized in the ED. I have low suspicion for any other emergent medical condition which would require further screening, evaluation or treatment in the ED or require inpatient management.   Final Clinical Impression(s) / ED Diagnoses Final diagnoses:  Hypertension, unspecified type  Essential hypertension  Chronic joint pain  Chronic back pain, unspecified back location, unspecified back pain laterality  Dyslipidemia  At risk for acute ischemic cardiac event    Rx / DC Orders ED Discharge Orders          Ordered    amLODipine (NORVASC) 5 MG tablet  Daily        09/06/21 1723    lisinopril-hydrochlorothiazide (ZESTORETIC) 20-25 MG tablet  Daily        09/06/21 1723    meloxicam (MOBIC) 15 MG tablet        09/06/21 1723    atorvastatin (LIPITOR) 20 MG tablet        09/06/21 1723    aspirin EC 81 MG tablet  Daily        09/06/21 1723             Regan Lemming, MD 09/06/21 1728

## 2021-09-06 NOTE — ED Triage Notes (Signed)
Pt. Stated, Derek Carroll been out of my medication for BP for a couple months. Ive had headaches with dizziness

## 2021-09-06 NOTE — ED Provider Notes (Signed)
[facility]    CSN: [id number] Arrival date & time: 09/06/21  0803      History   Chief Complaint Chief Complaint  Patient presents with   Hypertension   Medication Refill    HPI Derek Carroll is a 60 y.o. male presenting with blood pressure issue following being out of his blood pressure medications for about 3 months.  Medical history hypertension.  States that he was previously followed by [facility], they shut down and he has not been seen since.  Out of his amlodipine and lisinopril-hydrochlorothiazide for 3 months.  Now with new onset of headaches, dizziness, upper extremity weakness for about 2 weeks, getting worse.  The headaches are 9/10 and throbbing, not relieved by Tylenol.  Dizziness is intermittent but getting worse.  States that both upper extremities feel more weak than normal, but denies weakness or sensation changes on one side of his body.  Denies vision changes, chest pain, new shortness of breath, syncope.  HPI  Past Medical History:  Diagnosis Date   Back pain    Hypertension     Patient Active Problem List   Diagnosis Date Noted   Erectile dysfunction 10/03/2018   Pre-diabetes 02/27/2018   Hypertriglyceridemia 02/08/2017   Dyslipidemia 02/08/2017   Essential hypertension 12/26/2016   Chronic joint pain 08/02/2016    Past Surgical History:  Procedure Laterality Date   LEG SURGERY         Home Medications    Prior to Admission medications   Medication Sig Start Date End Date Taking? Authorizing Provider  acetaminophen (TYLENOL) 500 MG tablet Take 1,000 mg by mouth every 6 (six) hours as needed.    [provider]  amLODipine (NORVASC) 5 MG tablet TAKE 1 TABLET(5 MG) BY MOUTH DAILY 01/04/21   Just, Laurita Quint, FNP  aspirin EC 81 MG tablet Take 1 tablet (81 mg total) by mouth daily. 02/08/17   Tereasa Coop, PA-C  atorvastatin (LIPITOR) 20 MG tablet TAKE 1 TABLET(20 MG) BY MOUTH DAILY 06/08/20   Horald Pollen, MD  lisinopril-hydrochlorothiazide (ZESTORETIC) 20-25 MG tablet Take 1 tablet by mouth daily. 06/08/20   Horald Pollen, MD  meclizine (ANTIVERT) 25 MG tablet Take 1 tablet (25 mg total) by mouth 3 (three) times daily as needed for dizziness. 06/08/20   Horald Pollen, MD  meloxicam (MOBIC) 15 MG tablet TAKE 1/2 TO 1 TABLET BY MOUTH EVERY DAY 06/08/20   Horald Pollen, MD  Multiple Vitamin (MULTIVITAMIN) tablet Take 1 tablet by mouth daily.    [provider]  sildenafil (VIAGRA) 100 MG tablet Take 0.5-1 tablets (50-100 mg total) by mouth daily as needed for erectile dysfunction. 06/08/20   Horald Pollen, MD  traMADol (ULTRAM) 50 MG tablet Take 1 tablet (50 mg total) by mouth 2 (two) times daily. Patient not taking: Reported on 06/08/2020 09/26/18   Hilts, Legrand Como, MD  traMADol (ULTRAM) 50 MG tablet TAKE 1 TABLET(50 MG) BY MOUTH DAILY AS NEEDED FOR SEVERE PAIN Patient not taking: Reported on 06/08/2020 11/04/19   Horald Pollen, MD    Family History History reviewed. No pertinent family history.  Social History Social History   Tobacco Use   Smoking status: Every Day    Packs/day: 0.75    Years: 36.00    Pack years: 27.00    Types: Cigarettes   Smokeless tobacco: Never  Substance Use Topics   Alcohol use: No    Comment: Quit January 2016  Drug use: No     Allergies   Patient has no known allergies.   Review of Systems Review of Systems  Neurological:  Positive for dizziness, weakness and headaches.  All other systems reviewed and are negative.   Physical Exam Triage Vital Signs ED Triage Vitals  Enc Vitals Group     BP 09/06/21 0822 (!) 185/102     Pulse Rate 09/06/21 0822 64     Resp 09/06/21 0822 18     Temp 09/06/21 0822 97.6 F (36.4 C)     Temp src --      SpO2 09/06/21 0822 95 %     Weight --      Height --      Head Circumference --      Peak Flow --      Pain Score 09/06/21 0817 0     Pain Loc --      Pain  Edu? --      Excl. in [place]? --    No data found.  Updated Vital Signs BP (!) 185/102   Pulse 64   Temp 97.6 F (36.4 C)   Resp 18   SpO2 95%   Visual Acuity Right Eye Distance:   Left Eye Distance:   Bilateral Distance:    Right Eye Near:   Left Eye Near:    Bilateral Near:     Physical Exam Vitals reviewed.  Constitutional:      Appearance: Normal appearance. He is not diaphoretic.  HENT:     Head: Normocephalic and atraumatic.     Mouth/Throat:     Mouth: Mucous membranes are moist.  Eyes:     Extraocular Movements: Extraocular movements intact.     Pupils: Pupils are equal, round, and reactive to light.  Cardiovascular:     Rate and Rhythm: Normal rate and regular rhythm.     Pulses:          Radial pulses are 2+ on the right side and 2+ on the left side.     Heart sounds: Normal heart sounds.  Pulmonary:     Effort: Pulmonary effort is normal.     Breath sounds: Normal breath sounds.  Abdominal:     Palpations: Abdomen is soft.     Tenderness: There is no abdominal tenderness. There is no guarding or rebound.  Musculoskeletal:     Right lower leg: No edema.     Left lower leg: No edema.  Skin:    General: Skin is warm.     Capillary Refill: Capillary refill takes less than 2 seconds.  Neurological:     General: No focal deficit present.     Mental Status: He is alert and oriented to person, place, and time.     Comments: CN 2-12 grossly intact, PERRLA, EOMI. Strength and sensation grossly intact upper and lower extremities. Negative rhomberg, pronator drift, fingers to thumb.  Psychiatric:        Mood and Affect: Mood normal.        Behavior: Behavior normal.        Thought Content: Thought content normal.        Judgment: Judgment normal.     UC Treatments / Results  Labs (all labs ordered are listed, but only abnormal results are displayed) Labs Reviewed - No data to display  EKG   Radiology No results found.  Procedures Procedures  (including critical care time)  Medications Ordered in UC Medications - No data to display  Initial Impression / Assessment and Plan / UC Course  I have reviewed the triage vital signs and the nursing notes.  Pertinent labs & imaging results that were available during my care of the patient were reviewed by me and considered in my medical decision making (see chart for details).     This patient is a very pleasant 60 y.o. year old male presenting with hypertensive emergency: Blood pressure is 185/102, has been out of his antihypertensives for several months. Currently with dizziness and severe headaches that are not relieved by over-the-counter medications.  Neuro exam is reassuring, but I do have concern given his symptoms.  Sent to the emergency department for hypertensive emergency, he is in agreement and states he will head straight there.  Final Clinical Impressions(s) / UC Diagnoses   Final diagnoses:  Hypertensive emergency  Severe headache  Dizziness     Discharge Instructions      -Please head to the emergency department for evaluation of your significantly elevated blood pressure with headache and dizziness.  This is very concerning, as this puts you at a high risk of a stroke.  Your blood pressure needs to be lowered and monitored, which we cannot do in urgent care.  Please head straight there, if symptoms get worse on the way stop and call 911.     ED Prescriptions   None    PDMP not reviewed this encounter.   Hazel Sams, PA-C 09/06/21 [phone]

## 2021-09-06 NOTE — Discharge Instructions (Addendum)
-  Please head to the emergency department for evaluation of your significantly elevated blood pressure with headache and dizziness.  This is very concerning, as this puts you at a high risk of a stroke.  Your blood pressure needs to be lowered and monitored, which we cannot do in urgent care.  Please head straight there, if symptoms get worse on the way stop and call 911.

## 2021-09-06 NOTE — ED Provider Notes (Signed)
Emergency Medicine Provider Triage Evaluation Note  Derek Carroll , a 60 y.o. male  was evaluated in triage.  Pt complains of several months of worsening dizziness, severe headaches every morning, and uncontrolled blood pressure.  Patient reports that he normally takes hydrochlorothiazide, lisinopril, amlodipine for his blood pressure however he has not been able to take these medications.  Patient was evaluated in urgent care, but they sent him over here due to extremely high blood pressure, as well as concern over at the dizziness, headache symptoms.  Patient denies chest pain, shortness of breath, abdominal pain, lower leg swelling at this time.  It has been several months since he was able to see a primary care provider..  Review of Systems  Positive: As above Negative: As above  Physical Exam  BP (!) 167/89 (BP Location: Left Arm)   Pulse 88   Temp 98 F (36.7 C)   Resp 18   SpO2 96%  Gen:   Awake, no distress   Resp:  Normal effort  MSK:   Moves extremities without difficulty  Other:    Medical Decision Making  Medically screening exam initiated at 11:04 AM.  Appropriate orders placed.  Derek Carroll was informed that the remainder of the evaluation will be completed by another provider, this initial triage assessment does not replace that evaluation, and the importance of remaining in the ED until their evaluation is complete.  HTN, dizziness, headaches   Anselmo Pickler, PA-C 09/06/21 1106    Daleen Bo, MD 09/07/21 [phone]

## 2021-10-15 ENCOUNTER — Emergency Department (HOSPITAL_COMMUNITY)
Admission: EM | Admit: 2021-10-15 | Discharge: 2021-10-16 | Disposition: A | Discharge status: Home / Self Care | Payer: 59 | Attending: Emergency Medicine | Admitting: Emergency Medicine

## 2021-10-15 ENCOUNTER — Other Ambulatory Visit: Payer: Self-pay

## 2021-10-15 ENCOUNTER — Emergency Department (HOSPITAL_COMMUNITY): Payer: 59

## 2021-10-15 DIAGNOSIS — I10 Essential (primary) hypertension: Secondary | ICD-10-CM | POA: Insufficient documentation

## 2021-10-15 DIAGNOSIS — R42 Dizziness and giddiness: Secondary | ICD-10-CM | POA: Insufficient documentation

## 2021-10-15 DIAGNOSIS — F17210 Nicotine dependence, cigarettes, uncomplicated: Secondary | ICD-10-CM | POA: Insufficient documentation

## 2021-10-15 DIAGNOSIS — R112 Nausea with vomiting, unspecified: Secondary | ICD-10-CM | POA: Diagnosis not present

## 2021-10-15 LAB — BASIC METABOLIC PANEL
Anion gap: 10 (ref 5–15)
BUN: 16 mg/dL (ref 8–23)
CO2: 25 mmol/L (ref 22–32)
Calcium: 9.1 mg/dL (ref 8.9–10.3)
Chloride: 105 mmol/L (ref 98–111)
Creatinine, Ser: 1.02 mg/dL (ref 0.61–1.24)
GFR, Estimated: >60 mL/min (ref >60)
Glucose, Bld: 122 mg/dL — ABNORMAL HIGH (ref 70–99)
Potassium: 3.6 mmol/L (ref 3.5–5.1)
Sodium: 140 mmol/L (ref 135–145)

## 2021-10-15 LAB — CBC
HCT: 45.3 % (ref 39.0–52.0)
Hemoglobin: 15.2 g/dL (ref 13.0–17.0)
MCH: 32.8 pg (ref 26.0–34.0)
MCHC: 33.6 g/dL (ref 30.0–36.0)
MCV: 97.8 fL (ref 80.0–100.0)
Platelets: 314 10*3/uL (ref 150–400)
RBC: 4.63 MIL/uL (ref 4.22–5.81)
RDW: 11.9 % (ref 11.5–15.5)
WBC: 13.2 10*3/uL — ABNORMAL HIGH (ref 4.0–10.5)
nRBC: 0 % (ref 0.0–0.2)

## 2021-10-15 LAB — HEPATIC FUNCTION PANEL
ALT: 30 U/L (ref 0–44)
AST: 23 U/L (ref 15–41)
Albumin: 4.3 g/dL (ref 3.5–5.0)
Alkaline Phosphatase: 53 U/L (ref 38–126)
Bilirubin, Direct: 0.2 mg/dL (ref 0.0–0.2)
Indirect Bilirubin: 0.8 mg/dL (ref 0.3–0.9)
Total Bilirubin: 1 mg/dL (ref 0.3–1.2)
Total Protein: 7.5 g/dL (ref 6.5–8.1)

## 2021-10-15 LAB — LIPASE, BLOOD: Lipase: 72 U/L — ABNORMAL HIGH (ref 11–51)

## 2021-10-15 MED ORDER — MECLIZINE HCL 25 MG PO TABS
25.0000 mg | ORAL_TABLET | Freq: Once | ORAL | Status: AC
  Administered 2021-10-15: 25 mg via ORAL
  Filled 2021-10-15: qty 1

## 2021-10-15 MED ORDER — ONDANSETRON 4 MG PO TBDP
4.0000 mg | ORAL_TABLET | Freq: Once | ORAL | Status: AC
  Administered 2021-10-15: 4 mg via ORAL
  Filled 2021-10-15: qty 1

## 2021-10-15 NOTE — ED Triage Notes (Signed)
Pt c/o dizziness and nausea. Pt states he has not taken his BP meds since Tuesday. Pt denies headache.

## 2021-10-15 NOTE — ED Provider Notes (Signed)
Emergency Medicine Provider Triage Evaluation Note  Derek Carroll , a 60 y.o. male  was evaluated in triage.  Pt complains of vertigo, nausea, headache and some generalized abdominal pain and cramping.  He states that all the symptoms began around 7 PM when he was at work he states that his symptoms of been constant since.  Denies any numbness weakness slurred speech denies any blurry vision double vision denies any difficulty with word finding denies any head trauma.  States that his headache is mild to moderate seems to be circumferential.  Review of Systems  Positive: Vertigo, nausea, vomiting Negative: Numbness or weakness  Physical Exam  BP (!) 164/106   Pulse 75   Temp 97.8 F (36.6 C) (Oral)   Resp 18   Ht 5\' 10"  (1.778 m)   Wt 87.1 kg   SpO2 95%   BMI 27.55 kg/m  Gen:   Awake, uncomfortabl Resp:  Normal effort  MSK:   Moves extremities without difficulty Other:  Leftward horizontal nystagmus   Alert and oriented to self, place, time and event.   Speech is fluent, clear without dysarthria or dysphasia.   Strength 5/5 in upper/lower extremities   Sensation intact in upper/lower extremities   Normal finger-to-nose and feet tapping.  CN I not tested  CN II grossly intact visual fields bilaterally. Did not visualize posterior eye.  CN III, IV, VI PERRLA and EOMs intact bilaterally  CN V Intact sensation to sharp and light touch to the face  CN VII facial movements symmetric  CN VIII not tested  CN IX, X no uvula deviation, symmetric rise of soft palate  CN XI 5/5 SCM and trapezius strength bilaterally  CN XII Midline tongue protrusion, symmetric L/R movements    Medical Decision Making  Medically screening exam initiated at 8:30 PM.  Appropriate orders placed.  Derek Carroll was informed that the remainder of the evaluation will be completed by another provider, this initial triage assessment does not replace that evaluation, and the importance of remaining in the ED  until their evaluation is complete.  Patient has isolated persistent vertigo with normal neurologic exam.  He has leftward beating horizontal nystagmus.   Unlikely to be stroke w normal neuro exam. Requested RN assist in placing pt in major care expediently.     Derek Carroll, [place] 10/15/21 2037    Derek Pence, MD 10/15/21 2111

## 2021-10-16 MED ORDER — SODIUM CHLORIDE 0.9 % IV BOLUS
1000.0000 mL | Freq: Once | INTRAVENOUS | Status: AC
  Administered 2021-10-16: 05:00:00 1000 mL via INTRAVENOUS

## 2021-10-16 MED ORDER — AMLODIPINE BESYLATE 5 MG PO TABS
5.0000 mg | ORAL_TABLET | Freq: Every day | ORAL | 1 refills | Status: DC
Start: 2021-10-16 — End: 2021-10-17

## 2021-10-16 NOTE — Discharge Instructions (Signed)
You were evaluated in the Emergency Department and after careful evaluation, we did not find any emergent condition requiring admission or further testing in the hospital.  Your exam/testing today was overall reassuring.  Please return to the Emergency Department if you experience any worsening of your condition.  Thank you for allowing us to be a part of your care.  

## 2021-10-16 NOTE — ED Notes (Signed)
Ambulated pt in hall. Pt complained of being "wobbly but not dizzy" and lightheadedness, but had a steady gait

## 2021-10-16 NOTE — ED Provider Notes (Signed)
[facility] Emergency Department Provider Note MRN:  [id number]  Arrival date & time: 10/16/21     Chief Complaint   Dizziness, Nausea, and Emesis   History of Present Illness   Derek Carroll is a 60 y.o. year-old male with a history of hypertension presenting to the ED with chief complaint of dizziness.  Patient ate a large meal of Thanksgiving or leftovers.  Soon after he began experiencing nausea, episode of vomiting.  Also experienced dizzy spells, described as a spinning sensation.  Some abdominal discomfort as well.  No chest pain or shortness of breath, no numbness or weakness to the arms or legs.  Symptoms seem to be improving with time, currently without complaints.  Review of Systems  A complete 10 system review of systems was obtained and all systems are negative except as noted in the HPI and PMH.   Patient's Health History    Past Medical History:  Diagnosis Date   Back pain    Hypertension     Past Surgical History:  Procedure Laterality Date   LEG SURGERY      No family history on file.  Social History   Socioeconomic History   Marital status: Single    Spouse name: Not on file   Number of children: Not on file   Years of education: Not on file   Highest education level: Not on file  Occupational History   Not on file  Tobacco Use   Smoking status: Every Day    Packs/day: 0.75    Years: 36.00    Pack years: 27.00    Types: Cigarettes   Smokeless tobacco: Never  Substance and Sexual Activity   Alcohol use: No    Comment: Quit January 2016   Drug use: No   Sexual activity: Never  Other Topics Concern   Not on file  Social History Narrative   Not on file   Social Determinants of Health   Financial Resource Strain: Not on file  Food Insecurity: Not on file  Transportation Needs: Not on file  Physical Activity: Not on file  Stress: Not on file  Social Connections: Not on file  Intimate Partner Violence: Not on file      Physical Exam   Vitals:   10/16/21 0435 10/16/21 0500  BP: 130/80 119/77  Pulse: 70 69  Resp: 12 18  Temp:    SpO2: 94% 93%    CONSTITUTIONAL: Well-appearing, NAD NEURO:  Alert and oriented x 3, normal and symmetric strength and sensation, normal coordination, normal speech EYES:  eyes equal and reactive ENT/NECK:  no LAD, no JVD CARDIO: Regular rate, well-perfused, normal S1 and S2 PULM:  CTAB no wheezing or rhonchi GI/GU:  normal bowel sounds, non-distended, non-tender MSK/SPINE:  No gross deformities, no edema SKIN:  no rash, atraumatic PSYCH:  Appropriate speech and behavior  *Additional and/or pertinent findings included in MDM below  Diagnostic and Interventional Summary    EKG Interpretation  Date/Time:  Saturday October 15 2021 20:11:45 EST Ventricular Rate:  69 PR Interval:  142 QRS Duration: 94 QT Interval:  404 QTC Calculation: 432 R Axis:   51 Text Interpretation: Normal sinus rhythm Normal ECG Confirmed by Gerlene Fee [phone]) on 10/16/2021 4:28:10 AM       Labs Reviewed  BASIC METABOLIC PANEL - Abnormal; Notable for the following components:      Result Value   Glucose, Bld 122 (*)    All other components within normal limits  CBC -  Abnormal; Notable for the following components:   WBC 13.2 (*)    All other components within normal limits  LIPASE, BLOOD - Abnormal; Notable for the following components:   Lipase 72 (*)    All other components within normal limits  HEPATIC FUNCTION PANEL  URINALYSIS, ROUTINE W REFLEX MICROSCOPIC  CBG MONITORING, ED    CT HEAD WO CONTRAST (5MM)  Final Result      Medications  ondansetron (ZOFRAN-ODT) disintegrating tablet 4 mg (4 mg Oral Given 10/15/21 2036)  meclizine (ANTIVERT) tablet 25 mg (25 mg Oral Given 10/15/21 2036)  sodium chloride 0.9 % bolus 1,000 mL (1,000 mLs Intravenous New Bag/Given 10/16/21 0457)     Procedures  /  Critical Care Procedures  ED Course and Medical Decision Making  I  have reviewed the triage vital signs, the nursing notes, and pertinent available records from the EMR.  Listed above are laboratory and imaging tests that I personally ordered, reviewed, and interpreted and then considered in my medical decision making (see below for details).  Patient describing vertigo type dizziness, also with abdominal discomfort, nausea vomiting.  Abdomen is soft and nontender at this time, currently without abdominal pain.  Neurological exam is normal, no further dizziness at this time.  Overall favoring and not on neurologic cause of the dizziness, likely more of a GI cause.  Screening CT head is normal.  Will provide IV fluids and ambulate and determine if appropriate for discharge with return precautions.       Barth Kirks. Sedonia Small, [place] [email or web address]  Final Clinical Impressions(s) / ED Diagnoses     ICD-10-CM   1. Dizziness  R42       ED Discharge Orders     None        Discharge Instructions Discussed with and Provided to Patient:     Discharge Instructions      You were evaluated in the Emergency Department and after careful evaluation, we did not find any emergent condition requiring admission or further testing in the hospital.  Your exam/testing today was overall reassuring.  Please return to the Emergency Department if you experience any worsening of your condition.  Thank you for allowing [place] to be a part of your care.         Maudie Flakes, MD 10/16/21 [phone]

## 2021-10-17 ENCOUNTER — Other Ambulatory Visit: Payer: Self-pay

## 2021-10-17 ENCOUNTER — Encounter: Payer: Self-pay | Admitting: Internal Medicine

## 2021-10-17 ENCOUNTER — Ambulatory Visit (INDEPENDENT_AMBULATORY_CARE_PROVIDER_SITE_OTHER): Payer: 59 | Admitting: Internal Medicine

## 2021-10-17 VITALS — BP 142/77 | HR 69 | Temp 97.7°F | Resp 24 | Ht 70.0 in | Wt 202.3 lb

## 2021-10-17 DIAGNOSIS — M545 Low back pain, unspecified: Secondary | ICD-10-CM

## 2021-10-17 DIAGNOSIS — Z9189 Other specified personal risk factors, not elsewhere classified: Secondary | ICD-10-CM | POA: Diagnosis not present

## 2021-10-17 DIAGNOSIS — Z23 Encounter for immunization: Secondary | ICD-10-CM

## 2021-10-17 DIAGNOSIS — Z131 Encounter for screening for diabetes mellitus: Secondary | ICD-10-CM | POA: Diagnosis not present

## 2021-10-17 DIAGNOSIS — G8929 Other chronic pain: Secondary | ICD-10-CM

## 2021-10-17 DIAGNOSIS — E785 Hyperlipidemia, unspecified: Secondary | ICD-10-CM

## 2021-10-17 DIAGNOSIS — I10 Essential (primary) hypertension: Secondary | ICD-10-CM

## 2021-10-17 DIAGNOSIS — Z0000 Encounter for general adult medical examination without abnormal findings: Secondary | ICD-10-CM

## 2021-10-17 DIAGNOSIS — N529 Male erectile dysfunction, unspecified: Secondary | ICD-10-CM

## 2021-10-17 DIAGNOSIS — Z13220 Encounter for screening for lipoid disorders: Secondary | ICD-10-CM

## 2021-10-17 DIAGNOSIS — Z1211 Encounter for screening for malignant neoplasm of colon: Secondary | ICD-10-CM

## 2021-10-17 DIAGNOSIS — Z872 Personal history of diseases of the skin and subcutaneous tissue: Secondary | ICD-10-CM

## 2021-10-17 DIAGNOSIS — R7303 Prediabetes: Secondary | ICD-10-CM

## 2021-10-17 LAB — POCT GLYCOSYLATED HEMOGLOBIN (HGB A1C): Hemoglobin A1C: 4.9 % (ref 4.0–5.6)

## 2021-10-17 LAB — GLUCOSE, CAPILLARY: Glucose-Capillary: 171 mg/dL — ABNORMAL HIGH (ref 70–99)

## 2021-10-17 MED ORDER — LISINOPRIL-HYDROCHLOROTHIAZIDE 20-25 MG PO TABS: 1.0000 | ORAL_TABLET | Freq: Every day | ORAL | 2 refills | Status: DC

## 2021-10-17 MED ORDER — SILDENAFIL CITRATE 100 MG PO TABS: 50.0000 mg | ORAL_TABLET | Freq: Every day | ORAL | 11 refills | Status: DC | PRN

## 2021-10-17 MED ORDER — AMLODIPINE BESYLATE 5 MG PO TABS: 5.0000 mg | ORAL_TABLET | Freq: Every day | ORAL | 2 refills | Status: DC

## 2021-10-17 MED ORDER — ASPIRIN EC 81 MG PO TBEC: 81.0000 mg | DELAYED_RELEASE_TABLET | Freq: Every day | ORAL | 0 refills | Status: DC

## 2021-10-17 MED ORDER — ATORVASTATIN CALCIUM 20 MG PO TABS: ORAL_TABLET | ORAL | 3 refills | Status: DC

## 2021-10-17 MED ORDER — ASPIRIN EC 81 MG PO TBEC
81.0000 mg | DELAYED_RELEASE_TABLET | Freq: Every day | ORAL | 0 refills | Status: AC
Start: 2021-10-17 — End: ?

## 2021-10-17 NOTE — Progress Notes (Signed)
   CC: New to establish care at clinic for HTN   HPI:  [name] is a 60 y.o. male with a PMHx stated below and presents today for stated above. Please see the Encounters tab for problem-based Assessment & Plan for additional details.   Past Medical History:  Diagnosis Date   Back pain    Hypertension     Current Outpatient Medications on File Prior to Visit  Medication Sig Dispense Refill   meloxicam (MOBIC) 15 MG tablet TAKE 1/2 TO 1 TABLET BY MOUTH EVERY DAY 90 tablet 3   Multiple Vitamin (MULTIVITAMIN) tablet Take 1 tablet by mouth daily.     No current facility-administered medications on file prior to visit.    No family history on file.  Social History   Socioeconomic History   Marital status: Single    Spouse name: Not on file   Number of children: Not on file   Years of education: Not on file   Highest education level: Not on file  Occupational History   Not on file  Tobacco Use   Smoking status: Every Day    Packs/day: 0.75    Years: 36.00    Pack years: 27.00    Types: Cigarettes   Smokeless tobacco: Never  Substance and Sexual Activity   Alcohol use: No    Comment: Quit January 2016   Drug use: No   Sexual activity: Never  Other Topics Concern   Not on file  Social History Narrative   Not on file   Social Determinants of Health   Financial Resource Strain: Not on file  Food Insecurity: Not on file  Transportation Needs: Not on file  Physical Activity: Not on file  Stress: Not on file  Social Connections: Not on file  Intimate Partner Violence: Not on file    Review of Systems: ROS negative except for what is noted on the assessment and plan.  Vitals:   10/17/21 1057  BP: (!) 142/77  Pulse: 69  Resp: (!) 24  Temp: 97.7 F (36.5 C)  TempSrc: Oral  SpO2: 94%  Weight: 202 lb 4.8 oz (91.8 kg)  Height: 5\' 10"  (1.778 m)     Physical Exam: Constitutional: alert, well-appearing, in NAD HENT: normocephalic, atraumatic, mucous  membranes moist Eyes: conjunctiva non-erythematous, EOMI Cardiovascular: RRR, no m/r/g, non-edematous bilateral LE Pulmonary/Chest: normal work of breathing on RA, LCTAB Abdominal: soft, non-tender to palpation, non-distended MSK: normal bulk and tone  Neurological: A&O x 3 Skin: warm and dry     Assessment & Plan:   See Encounters Tab for problem based charting.  Patient seen with Dr. Tera Helper, MD  Internal Medicine Resident, PGY-1 [facility]

## 2021-10-17 NOTE — Assessment & Plan Note (Addendum)
Flu shot given Colonoscopy referral placed Derm referral place for annual skin check; hx of [place] All medications refilled

## 2021-10-17 NOTE — Assessment & Plan Note (Signed)
Sildenafil refilled

## 2021-10-17 NOTE — Patient Instructions (Signed)
Thank you, Derek Carroll for allowing [place] to provide your care today!  Today we discussed:  Medications are refilled  I will call you if any labs, tests, or imaging results require any further attention or action.   I have ordered the following labs for you:   Lab Orders         Lipid Profile         POC Hbg A1C       Referrals ordered today:   Referral Orders  No referral(s) requested today     Medications ordered  Start the following medications: Meds ordered this encounter  Medications   sildenafil (VIAGRA) 100 MG tablet    Sig: Take 0.5-1 tablets (50-100 mg total) by mouth daily as needed for erectile dysfunction.    Dispense:  10 tablet    Refill:  11   DISCONTD: lisinopril-hydrochlorothiazide (ZESTORETIC) 20-25 MG tablet    Sig: Take 1 tablet by mouth daily.    Dispense:  90 tablet    Refill:  2   atorvastatin (LIPITOR) 20 MG tablet    Sig: TAKE 1 TABLET(20 MG) BY MOUTH DAILY    Dispense:  90 tablet    Refill:  3   amLODipine (NORVASC) 5 MG tablet    Sig: Take 1 tablet (5 mg total) by mouth daily.    Dispense:  90 tablet    Refill:  2   DISCONTD: aspirin EC 81 MG tablet    Sig: Take 1 tablet (81 mg total) by mouth daily.    Dispense:  365 tablet    Refill:  0   lisinopril-hydrochlorothiazide (ZESTORETIC) 20-25 MG tablet    Sig: Take 1 tablet by mouth daily.    Dispense:  90 tablet    Refill:  2   aspirin EC 81 MG tablet    Sig: Take 1 tablet (81 mg total) by mouth daily.    Dispense:  365 tablet    Refill:  0     Follow up in: 1 mo    Should you have any questions or concerns please call the internal medicine clinic at [phone].     Lajean Manes, MD  Internal Medicine Resident, PGY-1 [facility]

## 2021-10-17 NOTE — Assessment & Plan Note (Addendum)
BP 142/77 today. Regimen includes amlodipine 5 and lisinopril-HCTZ 20-25 daily. Has not taken lisinopril-HCTZ in 1 week because he has been out. Otherwise, reports excellent med compliance, without any adverse effects. Denies SHOB, CP, headaches, and N/V. Discussed lifestyle modifications.   Continue current regimen without any med changes this visit F/u in 1 mo

## 2021-10-17 NOTE — Assessment & Plan Note (Addendum)
A1C 4.9 today, 5.2 three yr ago. Discussed lifestyle modifications including weight loss, diet, and exercise.   A1c in 1 year

## 2021-10-17 NOTE — Progress Notes (Signed)
   CC: Establish care   HPI:  Mr.Keshaun Winsett is a 60 y.o. male with a PMHx stated below and presents today for stated above. Please see the Encounters tab for problem-based Assessment & Plan for additional details.   Past Medical History:  Diagnosis Date   Back pain    Hypertension     Current Outpatient Medications on File Prior to Visit  Medication Sig Dispense Refill   amLODipine (NORVASC) 5 MG tablet Take 1 tablet (5 mg total) by mouth daily. 30 tablet 1   aspirin EC 81 MG tablet Take 1 tablet (81 mg total) by mouth daily. 365 tablet 0   atorvastatin (LIPITOR) 20 MG tablet TAKE 1 TABLET(20 MG) BY MOUTH DAILY 90 tablet 3   lisinopril-hydrochlorothiazide (ZESTORETIC) 20-25 MG tablet Take 1 tablet by mouth daily. 30 tablet 0   meloxicam (MOBIC) 15 MG tablet TAKE 1/2 TO 1 TABLET BY MOUTH EVERY DAY 90 tablet 3   Multiple Vitamin (MULTIVITAMIN) tablet Take 1 tablet by mouth daily.     sildenafil (VIAGRA) 100 MG tablet Take 0.5-1 tablets (50-100 mg total) by mouth daily as needed for erectile dysfunction. 10 tablet 11   No current facility-administered medications on file prior to visit.    No family history on file.  Social History   Socioeconomic History   Marital status: Single    Spouse name: Not on file   Number of children: Not on file   Years of education: Not on file   Highest education level: Not on file  Occupational History   Not on file  Tobacco Use   Smoking status: Every Day    Packs/day: 0.75    Years: 36.00    Pack years: 27.00    Types: Cigarettes   Smokeless tobacco: Never  Substance and Sexual Activity   Alcohol use: No    Comment: Quit January 2016   Drug use: No   Sexual activity: Never  Other Topics Concern   Not on file  Social History Narrative   Not on file   Social Determinants of Health   Financial Resource Strain: Not on file  Food Insecurity: Not on file  Transportation Needs: Not on file  Physical Activity: Not on file  Stress:  Not on file  Social Connections: Not on file  Intimate Partner Violence: Not on file    Review of Systems: ROS negative except for what is noted on the assessment and plan.  Vitals:   10/17/21 1057  BP: (!) 142/77  Pulse: 69  Resp: (!) 24  Temp: 97.7 F (36.5 C)  TempSrc: Oral  SpO2: 94%  Weight: 202 lb 4.8 oz (91.8 kg)  Height: 5\' 10"  (1.778 m)     Physical Exam: Constitutional: alert, well-appearing, in NAD HENT: normocephalic, atraumatic, mucous membranes moist Eyes: conjunctiva non-erythematous, EOMI Cardiovascular: RRR, no m/r/g, non-edematous bilateral LE Pulmonary/Chest: normal work of breathing on RA, LCTAB Abdominal: soft, non-tender to palpation, non-distended MSK: normal bulk and tone  Neurological: A&O x 3, 5/5 strength in bilateral upper and lower extremities  Skin: warm and dry  Psych: normal behavior, normal affect    Assessment & Plan:   See Encounters Tab for problem based charting.  Patient seen with Dr. Tera Helper, MD  Internal Medicine Resident, PGY-1 [facility]

## 2021-10-17 NOTE — Assessment & Plan Note (Addendum)
Lipid panel 3 yrs ago with LDL 38. On atorvastatin 20.  F/u on lipid panel from today   Addendum: LDL 53 with triglyceride 220. Continue atorvastatin 20. Dicussed with pt importance of diet, weight loss and daily exercise.

## 2021-10-17 NOTE — Assessment & Plan Note (Addendum)
Has hx of chronic low back pain 2/2 degenerative disc disease via imaging in 2015. Pt reports pain is stable with use of tylenol and occasional meloxican. Discussed lifestyle modifications including daily exercise, thermotherapy and conservative pain management.   Meloxican refilled  Thermotherapy  Consider discussing PT referral if pt complains of progression of pain at f/u visit

## 2021-10-18 ENCOUNTER — Other Ambulatory Visit: Payer: Self-pay

## 2021-10-18 ENCOUNTER — Encounter: Payer: 59 | Admitting: Internal Medicine

## 2021-10-18 DIAGNOSIS — G8929 Other chronic pain: Secondary | ICD-10-CM

## 2021-10-18 DIAGNOSIS — M2550 Pain in unspecified joint: Secondary | ICD-10-CM

## 2021-10-18 LAB — LIPID PANEL
Chol/HDL Ratio: 3.4 ratio (ref 0.0–5.0)
Cholesterol, Total: 124 mg/dL (ref 100–199)
HDL: 36 mg/dL — ABNORMAL LOW (ref >39)
LDL Chol Calc (NIH): 53 mg/dL (ref 0–99)
Triglycerides: 220 mg/dL — ABNORMAL HIGH (ref 0–149)
VLDL Cholesterol Cal: 35 mg/dL (ref 5–40)

## 2021-10-18 MED ORDER — AMLODIPINE BESYLATE 5 MG PO TABS: 5.0000 mg | ORAL_TABLET | Freq: Every day | ORAL | 2 refills | Status: DC

## 2021-10-18 MED ORDER — LISINOPRIL-HYDROCHLOROTHIAZIDE 20-25 MG PO TABS: 1.0000 | ORAL_TABLET | Freq: Every day | ORAL | 2 refills | Status: DC

## 2021-10-18 MED ORDER — MELOXICAM 15 MG PO TABS: ORAL_TABLET | ORAL | 2 refills | Status: DC

## 2021-10-18 NOTE — Telephone Encounter (Signed)
Return pt's call - stated he received all of his meds except for Meloxicam. Per chart rx was written 09/06/21 with 3 RF's but as "Print" - pt stated the pharmacy told him he has no RF's. Pt saw Dr Posey Pronto yesterday. Thanks

## 2021-10-18 NOTE — Telephone Encounter (Signed)
Requesting to speak with a nurse about meloxicam (MOBIC) 15 MG tablet. Please call pt back.

## 2021-10-18 NOTE — Addendum Note (Signed)
Addended byLajean Manes on: 10/18/2021 09:01 AM   Modules accepted: Orders

## 2021-10-19 NOTE — Progress Notes (Signed)
Internal Medicine Clinic Attending  I saw and evaluated the patient.  I personally confirmed the key portions of the history and exam documented by Dr. Patel and I reviewed pertinent patient test results.  The assessment, diagnosis, and plan were formulated together and I agree with the documentation in the resident's note.  

## 2021-10-25 ENCOUNTER — Encounter: Payer: 59 | Admitting: Dietician

## 2021-12-05 IMAGING — CT CT HEAD W/O CM
3 of 4 series · 14 of 47 positions shown, 16 images · non-contrast
Comparison: None.

CLINICAL DATA: Dizziness, hypertension

EXAM:
CT HEAD WITHOUT CONTRAST
TECHNIQUE: Contiguous axial images were obtained from the base of the skull
through the vertex without intravenous contrast.

[Series 4: head 2.0 h70h · axial · 0.42mm/px · z∈[-135,-1]mm · 8 of 85 slices shown, 10 images]
[im 9/85  brain]
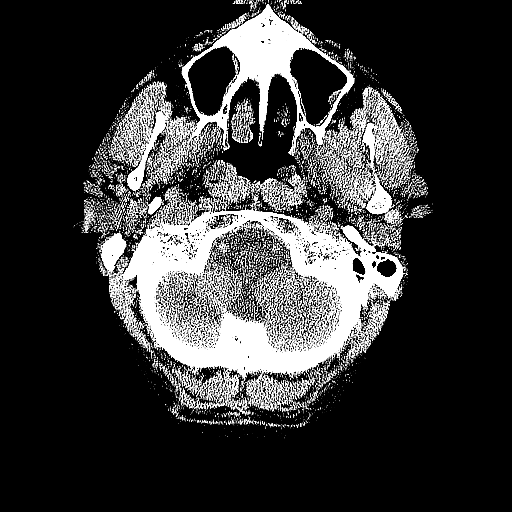
[im 9/85  bone]
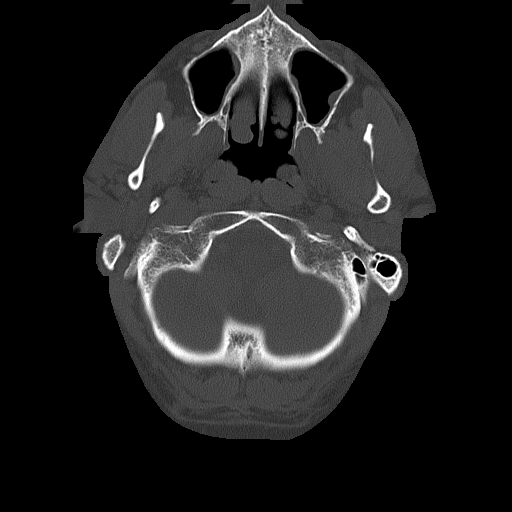
[im 17/85  brain]
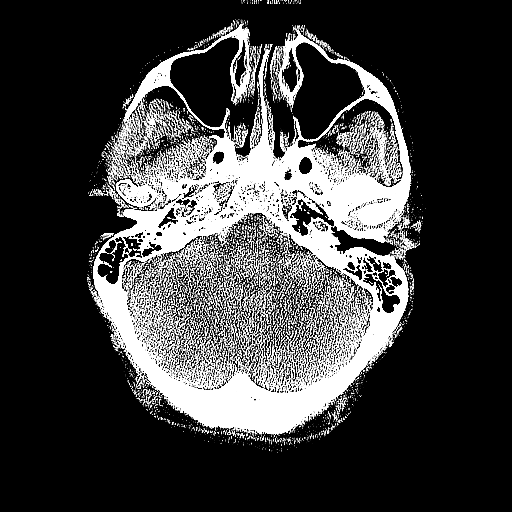
[im 26/85  brain]
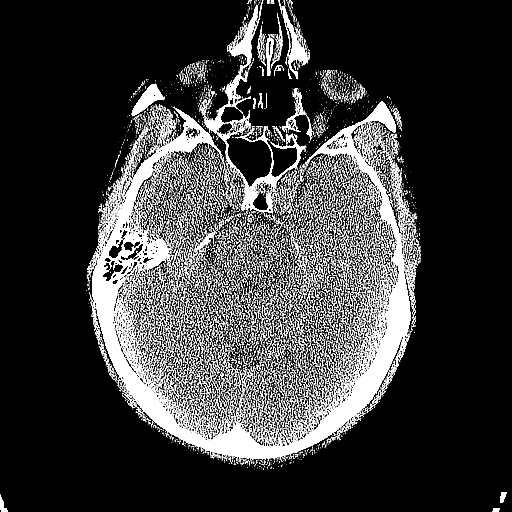
[im 38/85  brain]
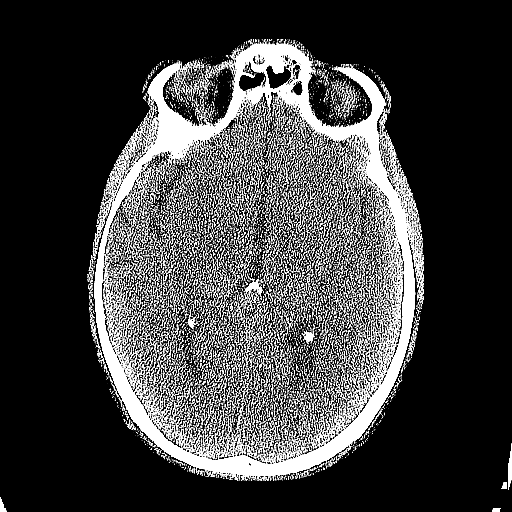
[im 47/85  brain]
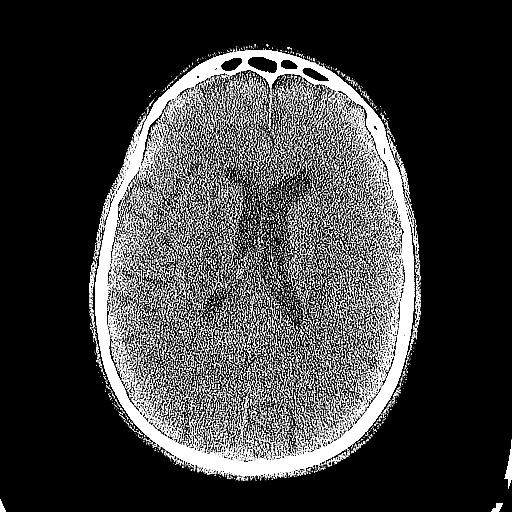
[im 47/85  bone]
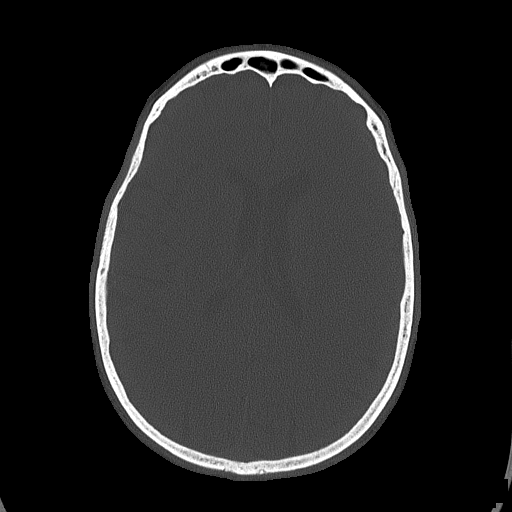
[im 59/85  brain]
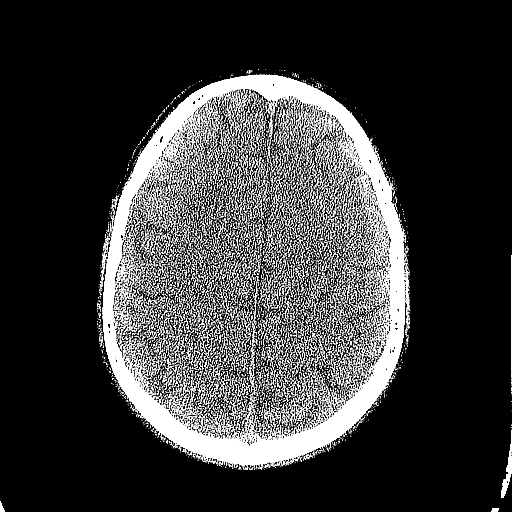
[im 68/85  brain]
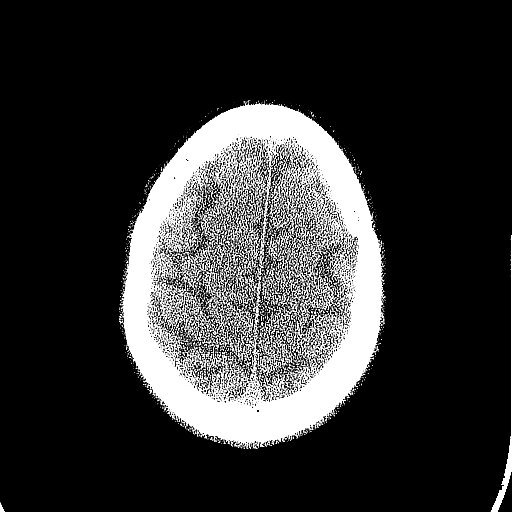
[im 76/85  brain]
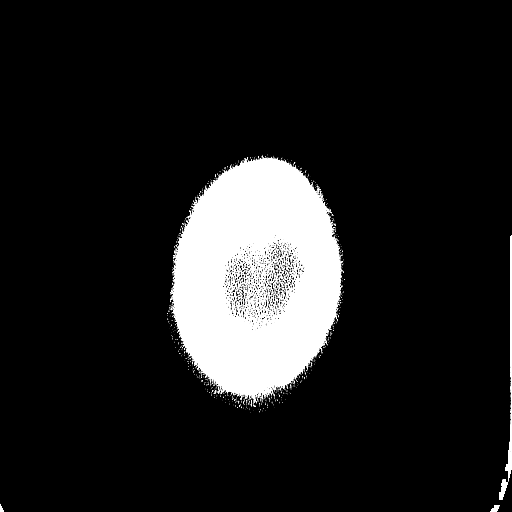

[Series 5: head 3.0 mpr cor · coronal · 0.33mm/px · 3 of 70 slices shown]
[im 24/70  brain]
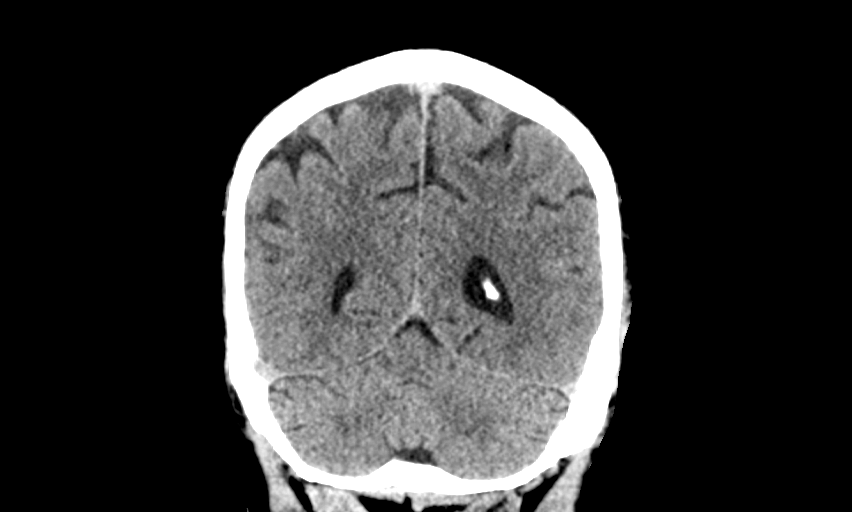
[im 31/70  brain]
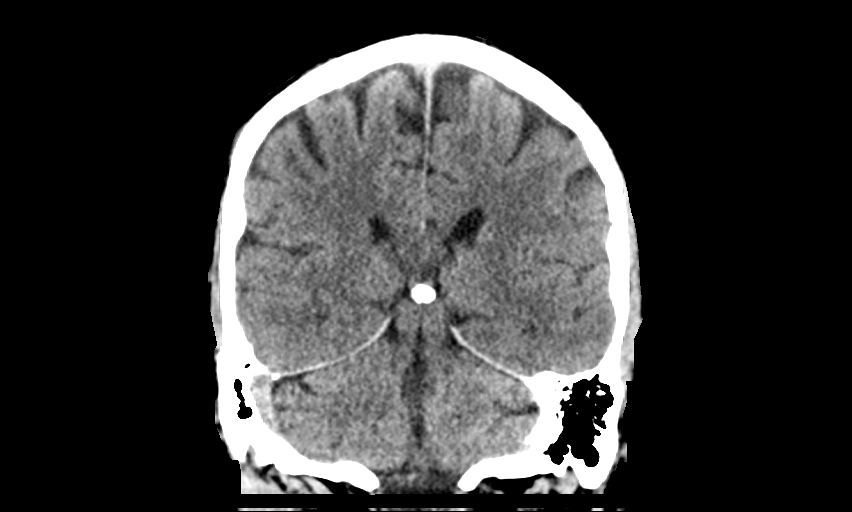
[im 39/70  brain]
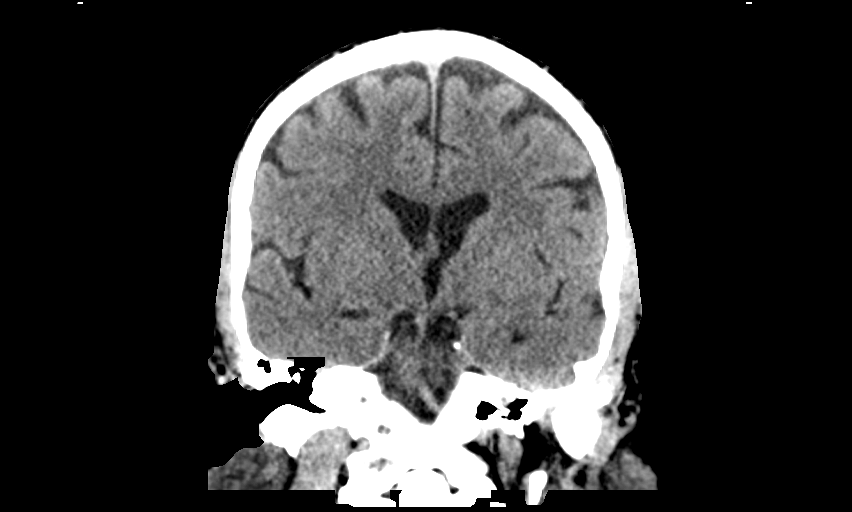

[Series 6: head 3.0 mpr sag · sagittal · 0.33mm/px · 3 of 67 slices shown]
[im 23/67  brain]
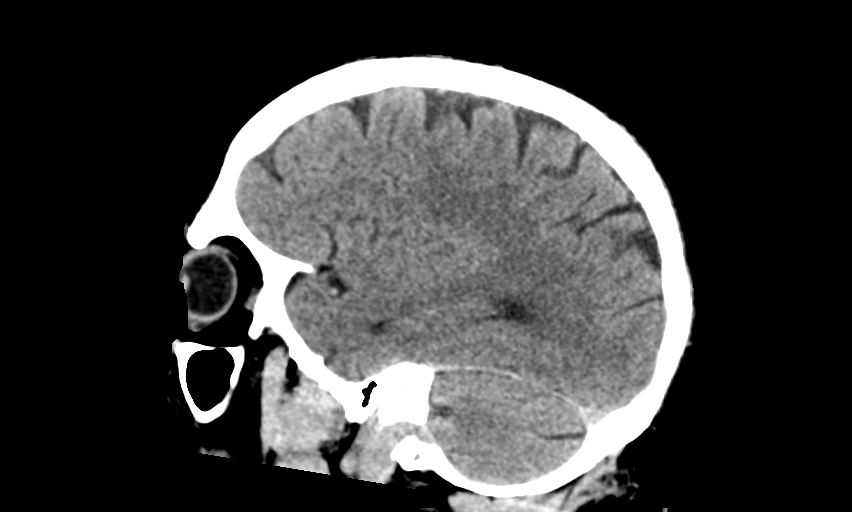
[im 34/67  brain]
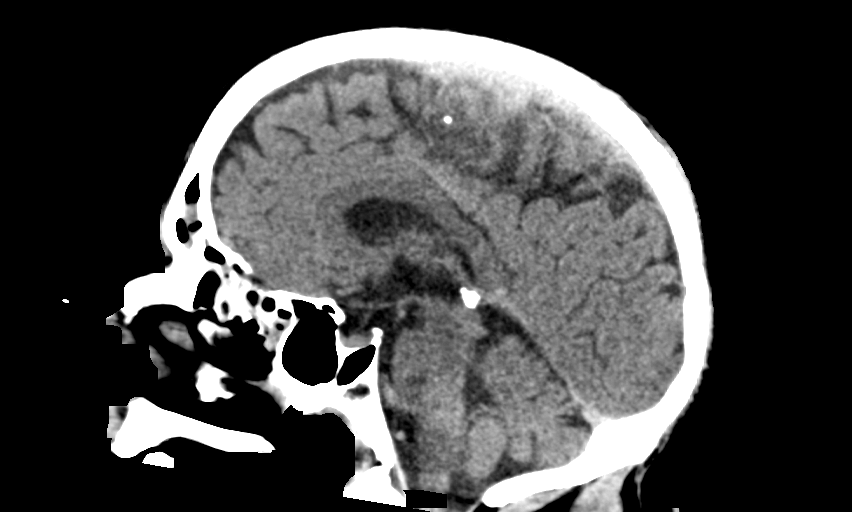
[im 45/67  brain]
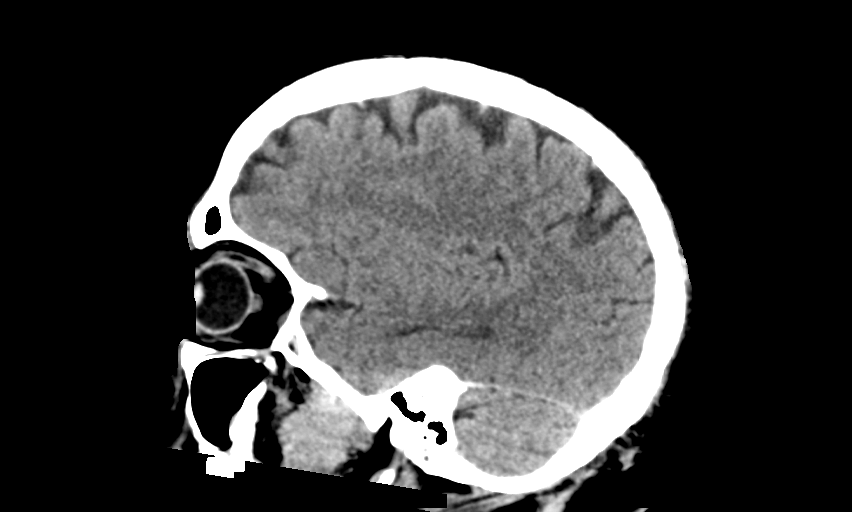

[14 of 47 positions shown; findings below may reference images not displayed]

FINDINGS: Brain: No evidence of acute infarction, hemorrhage, cerebral edema,
mass, mass effect, or midline shift. No hydrocephalus or extra-axial
fluid collection.

Vascular: No hyperdense vessel.

Skull: Normal. Negative for fracture or focal lesion.

Sinuses/Orbits: Mucosal thickening throughout the paranasal sinuses,
largely sparing the sphenoid sinuses

Other: The mastoid air cells are well aerated.
IMPRESSION: IMPRESSION
No acute intracranial process.

## 2022-02-20 ENCOUNTER — Ambulatory Visit (INDEPENDENT_AMBULATORY_CARE_PROVIDER_SITE_OTHER): Payer: 59 | Admitting: Internal Medicine

## 2022-02-20 VITALS — BP 121/71 | HR 72 | Temp 97.8°F | Ht 70.0 in | Wt 192.0 lb

## 2022-02-20 DIAGNOSIS — R42 Dizziness and giddiness: Secondary | ICD-10-CM | POA: Insufficient documentation

## 2022-02-20 DIAGNOSIS — E785 Hyperlipidemia, unspecified: Secondary | ICD-10-CM | POA: Diagnosis not present

## 2022-02-20 DIAGNOSIS — I10 Essential (primary) hypertension: Secondary | ICD-10-CM | POA: Diagnosis not present

## 2022-02-20 MED ORDER — ATORVASTATIN CALCIUM 20 MG PO TABS: ORAL_TABLET | ORAL | 3 refills | Status: DC

## 2022-02-20 NOTE — Progress Notes (Signed)
? ?  CC: Lightheadedness ? ?HPI: ? ?[name] is a 61 y.o. male with hypertension who presents to the [facility] with lightheadedness. Please see problem-based list for further details, assessments, and plans. ? ? ?Past Medical History:  ?Diagnosis Date  ? Back pain   ? Hypertension   ? ?Review of Systems:  Review of Systems  ?Constitutional:  Negative for chills, fever and weight loss.  ?HENT: Negative.    ?Eyes:  Negative for blurred vision.  ?Respiratory:  Negative for shortness of breath.   ?Cardiovascular:  Negative for chest pain and palpitations.  ?Gastrointestinal:  Negative for abdominal pain, diarrhea, nausea and vomiting.  ?Genitourinary:  Negative for dysuria and hematuria.  ?Neurological:  Positive for dizziness. Negative for loss of consciousness, weakness and headaches.  ? ? ?Physical Exam: ? ?Vitals:  ? 02/20/22 1407  ?BP: 130/64  ?Pulse: 71  ?Temp: 97.8 ?F (36.6 ?C)  ?TempSrc: Oral  ?SpO2: 94%  ?Weight: 192 lb (87.1 kg)  ?Height: '5\' 10"'$  (1.778 m)  ? ?General: Pleasant, well-appearing male. No acute distress. ?CV: RRR. No murmurs, rubs, or gallops. No LE edema ?Pulmonary: Lungs CTAB. Normal effort.  ?Abdominal: Soft, nontender, nondistended. Normal bowel sounds. ?Extremities: Palpable radial and DP pulses. Normal ROM. ?Skin: Warm and dry.  ?Neuro: A&Ox3. No focal deficit. ?Psych: Normal mood and affect ? ? ?Assessment & Plan:  ? ?See Encounters Tab for problem based charting. ? ?Patient discussed with Dr. Heber [place] ? ?

## 2022-02-20 NOTE — Assessment & Plan Note (Signed)
Patient presented today endorsing intermittent lightheadedness, specifically when transitioning from laying down to standing up. The patient denies any LOC, headache, current dizziness, chest pain, palpitations, SOB, n/v/d, or any other sxs at this time. He states that he has been taking his amlodipine and lisinopril-HCTZ regularly, with no missed doses. He also notes that he does not drink any water throughout the day. He drinks a cup of coffee in the morning and usually one soda in the afternoon- he has been told he may be dehydrated in the past. Orthostatic vital signs were obtained and were negative.  ? ?Discussed that if the patient were to get sick (vomiting, diarrhea) that he should hold his lisinopril-HCTZ, as this medication could make him more dehydrated. In the meantime, stressed the importance of oral hydration and replacing his soda intake with water, which the patient understands. Suspect that poor oral intake is the cause of the patient's lightheadedness. ? ?Plan: ?- Encourage oral hydration ?- Continue BP meds as prescribed, although advised to hold lisinopril-hctz if he has an acute illness ?- Follow up 3 months ?

## 2022-02-20 NOTE — Assessment & Plan Note (Signed)
BP Readings from Last 3 Encounters:  ?02/20/22 121/71  ?10/17/21 (!) 142/77  ?10/16/21 136/79  ? ? ?BP well controlled today. Advised to continue amlodipine 5 mg daily and lisinopril-HCTZ 20-25 mg daily. Also advised the patient to hold his lisinopril-HCTZ if he were to develop an acute illness and become dehydrated. He denies any headache, blurry vision, chest pain, SOB, or any other sxs.  ? ?Follow up in 3 months for BP recheck.  ?

## 2022-02-20 NOTE — Patient Instructions (Signed)
Thank you, [name] for allowing [place] to provide your care today. Today we discussed: ? ?Lightheadedness/dizziness: Your blood pressure was excellent today in the clinic! Continue to take your amlodipine and lisinopril-hydrochlorothiazide. If you find that you are feeling dehydrated or if you have vomiting/diarrhea, you can HOLD the lisinopril-hydrochlorothiazide, as this medicine can make you prone to getting dehydrated. Additionally, I encourage you to increase your water intake and to decrease the number of sugary drinks you have in a day.  ? ?I have ordered the following labs for you: ? ?Lab Orders  ?No laboratory test(s) ordered today  ?  ? ?Tests ordered today: ? ?none ? ?Referrals ordered today:  ? ?Referral Orders  ?No referral(s) requested today  ?  ? ?I have ordered the following medication/changed the following medications:  ? ?Stop the following medications: ?There are no discontinued medications.  ? ?Start the following medications: ?No orders of the defined types were placed in this encounter. ?  ? ?Follow up: 3 months  ? ? ?Remember: To drink at least 3-4 water bottles a day! ? ?Should you have any questions or concerns please call the internal medicine clinic at [phone].   ? ? ?Buddy Duty, D.O. ?[place] ? ? ?

## 2022-02-21 NOTE — Progress Notes (Signed)
Internal Medicine Clinic Attending ° °Case discussed with Dr. Atway  At the time of the visit.  We reviewed the resident’s history and exam and pertinent patient test results.  I agree with the assessment, diagnosis, and plan of care documented in the resident’s note.  °

## 2022-03-27 NOTE — Telephone Encounter (Signed)
NA

## 2022-04-24 ENCOUNTER — Ambulatory Visit (INDEPENDENT_AMBULATORY_CARE_PROVIDER_SITE_OTHER): Payer: 59 | Admitting: Student

## 2022-04-24 DIAGNOSIS — G8929 Other chronic pain: Secondary | ICD-10-CM

## 2022-04-24 DIAGNOSIS — F17210 Nicotine dependence, cigarettes, uncomplicated: Secondary | ICD-10-CM

## 2022-04-24 DIAGNOSIS — I10 Essential (primary) hypertension: Secondary | ICD-10-CM

## 2022-04-24 DIAGNOSIS — M2550 Pain in unspecified joint: Secondary | ICD-10-CM

## 2022-04-24 DIAGNOSIS — M545 Low back pain, unspecified: Secondary | ICD-10-CM

## 2022-04-24 DIAGNOSIS — S39012A Strain of muscle, fascia and tendon of lower back, initial encounter: Secondary | ICD-10-CM | POA: Diagnosis not present

## 2022-04-24 MED ORDER — MELOXICAM 15 MG PO TABS: ORAL_TABLET | ORAL | 2 refills | Status: DC

## 2022-04-24 MED ORDER — METHOCARBAMOL 500 MG PO TABS: 500.0000 mg | ORAL_TABLET | Freq: Four times a day (QID) | ORAL | 0 refills | Status: DC | PRN

## 2022-04-24 NOTE — Patient Instructions (Addendum)
Mr. Resnik,  It was a pleasure seeing you in the clinic today.   Please use meloxicam as need and tylenol as needed for your back sprain. I have also sent a prescription for Robaxin (muscle relaxant) to help with the stiffness in your back -- please only use this as needed and no more than 4 times a day. I have provided a work note for you as well. Please do not lift any heavy objects to let your back heal. Please do the stretching exercises to the best of your ability. Come back in 3 weeks for a follow up visit to make sure your back pain is improving.  Please call our clinic at [phone] if you have any questions or concerns. The best time to call is Monday-Friday from 9am-4pm, but there is someone available 24/7 at the same number. If you need medication refills, please notify your pharmacy one week in advance and they will send [place] a request.   Thank you for letting us take part in your care. We look forward to seeing you next time!

## 2022-04-24 NOTE — Assessment & Plan Note (Signed)
Mr. Wynter presents with complaints of low back pain that began last Wednesday. 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 Last Wednesday, he pulled a muscle in his low back while lifting a rim and has noted worsened back pain since that time. He has chronic low back from lumbar DDD that is typically well controlled with tylenol prn and occasional meloxicam. Per patient, this pain feels similar but worse. He feels it on the sides of his low back. He does not have any red flag symptoms aside from his age (no bowel/bladder dysfunction, midline spinal tenderness, history of IVDU or cancer, fevers/chills). On my exam, he does not have any midline spinal tenderness or radiation of pain but does have paraspinal muscle tenderness limiting ROM. Given history and exam, this appears to be a lumbar paraspinal muscle sprain. Advised patient to use meloxicam prn and tylenol prn for pain relief. Will prescribe a 2-week course of robaxin to help with spasm/spasticity. Also provided lumbar stretching exercises to help. Given nature of his work, provided a work note to avoid heavy lifting for the next 2 weeks. Advised follow up in 3 weeks to reassess at that time. He is in agreement with this.  Plan: -meloxicam prn, tylenol prn -robaxin qid prn x2 weeks -work note to avoid heavy lifting for 2 weeks -f/u in 3 weeks

## 2022-04-24 NOTE — Assessment & Plan Note (Signed)
Today's Vitals   04/24/22 1426  BP: 135/76  Pulse: 62  Temp: 97.9 F (36.6 C)  TempSrc: Oral  SpO2: 96%  Weight: 188 lb 3.2 oz (85.4 kg)  Height: '5\' 10"'$  (1.778 m)  PainSc: 10-Worst pain ever   Body mass index is 27 kg/m.   Patient with history of HTN, on zestoretic 20-'25mg'$  daily and norvasc '5mg'$  daily. Will continue current medications at this time.

## 2022-04-24 NOTE — Progress Notes (Signed)
   CC: acute low back pain  HPI:  [name] is a 61 y.o. male with history listed below presenting to the [facility] for evaluation of acute low back pain for the past 5 days. Please see individualized problem based charting for full HPI.  Past Medical History:  Diagnosis Date   Back pain    Hypertension     Review of Systems:  Negative aside from that listed in individualized problem based charting.  Physical Exam:  Vitals:   04/24/22 1426  BP: 135/76  Pulse: 62  Temp: 97.9 F (36.6 C)  TempSrc: Oral  SpO2: 96%  Weight: 188 lb 3.2 oz (85.4 kg)  Height: '5\' 10"'$  (1.778 m)   Physical Exam Constitutional:      Appearance: Normal appearance. He is not ill-appearing.  HENT:     Nose: Nose normal. No congestion.     Mouth/Throat:     Mouth: Mucous membranes are moist.     Pharynx: Oropharynx is clear.  Eyes:     Extraocular Movements: Extraocular movements intact.     Conjunctiva/sclera: Conjunctivae normal.     Pupils: Pupils are equal, round, and reactive to light.  Cardiovascular:     Rate and Rhythm: Normal rate and regular rhythm.     Pulses: Normal pulses.     Heart sounds: Normal heart sounds. No murmur heard.   No gallop.  Pulmonary:     Effort: Pulmonary effort is normal.     Breath sounds: Normal breath sounds. No wheezing, rhonchi or rales.  Abdominal:     General: Bowel sounds are normal. There is no distension.     Palpations: Abdomen is soft.     Tenderness: There is no abdominal tenderness.  Musculoskeletal:        General: No swelling.     Comments: Paraspinal lumbar tenderness bilaterally. No midline spinal tenderness noted. Limited ROM in lower back.   Skin:    General: Skin is warm and dry.  Neurological:     General: No focal deficit present.     Mental Status: He is alert and oriented to person, place, and time.  Psychiatric:        Mood and Affect: Mood normal.        Behavior: Behavior normal.     Assessment & Plan:   See Encounters  Tab for problem based charting.  Patient discussed with Dr. [name]

## 2022-04-27 NOTE — Progress Notes (Signed)
Internal Medicine Clinic Attending  Case discussed with Dr. Jinwala  At the time of the visit.  We reviewed the resident's history and exam and pertinent patient test results.  I agree with the assessment, diagnosis, and plan of care documented in the resident's note.  

## 2022-05-01 ENCOUNTER — Ambulatory Visit (INDEPENDENT_AMBULATORY_CARE_PROVIDER_SITE_OTHER): Payer: 59 | Admitting: Student

## 2022-05-01 VITALS — BP 132/79 | HR 61 | Temp 98.0°F | Ht 70.0 in | Wt 189.1 lb

## 2022-05-01 DIAGNOSIS — G8929 Other chronic pain: Secondary | ICD-10-CM

## 2022-05-01 DIAGNOSIS — S39012A Strain of muscle, fascia and tendon of lower back, initial encounter: Secondary | ICD-10-CM

## 2022-05-01 MED ORDER — MELOXICAM 15 MG PO TABS: ORAL_TABLET | ORAL | 0 refills | Status: DC

## 2022-05-01 NOTE — Progress Notes (Signed)
   CC: f/u paraspinal lumbar strain  HPI:  Derek Carroll is a 61 y.o. male with history listed below presenting to the [facility] for f/u paraspinal lumbar strain. Please see individualized problem based charting for full HPI.  Past Medical History:  Diagnosis Date   Back pain    Hypertension     Review of Systems:  Negative aside from that listed in individualized problem based charting.  Physical Exam:  Vitals:   05/01/22 1405  BP: 132/79  Pulse: 61  Temp: 98 F (36.7 C)  TempSrc: Oral  SpO2: 95%  Weight: 189 lb 1.6 oz (85.8 kg)  Height: '5\' 10"'$  (1.778 m)   Physical Exam Constitutional:      Appearance: Normal appearance. He is not ill-appearing.  HENT:     Mouth/Throat:     Mouth: Mucous membranes are moist.     Pharynx: Oropharynx is clear.  Eyes:     Extraocular Movements: Extraocular movements intact.     Conjunctiva/sclera: Conjunctivae normal.     Pupils: Pupils are equal, round, and reactive to light.  Cardiovascular:     Rate and Rhythm: Normal rate and regular rhythm.     Pulses: Normal pulses.     Heart sounds: Normal heart sounds. No murmur heard.    No friction rub. No gallop.  Pulmonary:     Effort: Pulmonary effort is normal.     Breath sounds: Normal breath sounds. No wheezing, rhonchi or rales.  Abdominal:     General: Bowel sounds are normal. There is no distension.     Palpations: Abdomen is soft.     Tenderness: There is no abdominal tenderness.  Musculoskeletal:     Comments: Limited ROM in lower back with flexion and extension, although improved in comparison to last visit.  Skin:    General: Skin is warm and dry.  Neurological:     General: No focal deficit present.     Mental Status: He is alert and oriented to person, place, and time.  Psychiatric:        Mood and Affect: Mood normal.        Behavior: Behavior normal.      Assessment & Plan:   See Encounters Tab for problem based charting.  Patient discussed with Dr.  [name]

## 2022-05-01 NOTE — Assessment & Plan Note (Addendum)
Patient presents for follow up for paraspinal lumbar strain that occurred last week. He states that his back pain and stiffness are worst in the morning but improve after morning stretches and a hot shower. It does recur in the afternoon, but he endorses being sedentary for the past week since the injury. He has been taking tylenol prn, robaxin prn, and meloxicam. He does endorse that he has been taking 4 tablets of meloxicam daily and has now run out. Emphasized importance of only once a day dosing of meloxicam, which he understands. Advised him to continue with tylenol prn, robaxin prn, stretching exercises, alternating heat and ice packs. Also emphasized importance of daily moderate intensity exercise (aside from heavy lifting) to help with pain and stiffness.   We also discussed importance of tempering expectations. Discussed that this acute pain from the strain should resolve within the next week or two, but that his underlying chronic low back pain will likely persist.  Will check BMP today given patient has been taking 4 meloxicam tablets daily for the past week.  Plan: -continue tylenol prn, robaxin prn -meloxicam only once a day as needed (provided refill) -stretching exercises, moderate intensity exercise, heat and ice packs -f/u BMP -f/u in 2 weeks to ensure continued improvement

## 2022-05-01 NOTE — Patient Instructions (Signed)
Derek Carroll,  It was a pleasure seeing you in the clinic today.   I am glad that your back pain/stiffness is gradually improving. Please make sure to exercise daily (no heavy lifting) so that your back does not become very stiff. Please only take the meloxicam once a daily as needed. Do not take more than 1 tablet in a single day. I am going to check some blood work today to evaluate your electrolytes.  Please come back in 2 weeks or sooner if your back pain is worsening.  Please call our clinic at [phone] if you have any questions or concerns. The best time to call is Monday-Friday from 9am-4pm, but there is someone available 24/7 at the same number. If you need medication refills, please notify your pharmacy one week in advance and they will send [place] a request.   Thank you for letting us take part in your care. We look forward to seeing you next time!

## 2022-05-02 LAB — BMP8+ANION GAP
Anion Gap: 12 mmol/L (ref 10.0–18.0)
BUN/Creatinine Ratio: 14 (ref 10–24)
BUN: 14 mg/dL (ref 8–27)
CO2: 27 mmol/L (ref 20–29)
Calcium: 9.5 mg/dL (ref 8.6–10.2)
Chloride: 100 mmol/L (ref 96–106)
Creatinine, Ser: 0.97 mg/dL (ref 0.76–1.27)
Glucose: 94 mg/dL (ref 70–99)
Potassium: 4.7 mmol/L (ref 3.5–5.2)
Sodium: 139 mmol/L (ref 134–144)
eGFR: 89 mL/min/{1.73_m2} (ref >59)

## 2022-05-03 NOTE — Progress Notes (Signed)
Internal Medicine Clinic Attending  Case discussed with Dr. Jinwala  At the time of the visit.  We reviewed the resident's history and exam and pertinent patient test results.  I agree with the assessment, diagnosis, and plan of care documented in the resident's note.  

## 2022-05-08 ENCOUNTER — Telehealth: Payer: Self-pay | Admitting: *Deleted

## 2022-05-08 NOTE — Telephone Encounter (Signed)
Patient walked in requesting new letter to return to work 05/09/22 with no restrictions. Previous letter written 6/5 was for 2 weeks of light duty. Patient is in waiting room.

## 2022-05-15 ENCOUNTER — Encounter: Payer: 59 | Admitting: Student

## 2022-06-08 ENCOUNTER — Other Ambulatory Visit: Payer: Self-pay | Admitting: Student

## 2022-06-08 NOTE — Telephone Encounter (Signed)
Next appt scheduled  7/31 with PCP.

## 2022-06-19 ENCOUNTER — Encounter: Payer: Self-pay | Admitting: Internal Medicine

## 2022-06-19 ENCOUNTER — Encounter: Payer: 59 | Admitting: Internal Medicine

## 2022-10-23 ENCOUNTER — Other Ambulatory Visit: Payer: Self-pay | Admitting: Internal Medicine

## 2022-10-23 DIAGNOSIS — N529 Male erectile dysfunction, unspecified: Secondary | ICD-10-CM

## 2022-10-23 MED ORDER — SILDENAFIL CITRATE 100 MG PO TABS: 50.0000 mg | ORAL_TABLET | Freq: Every day | ORAL | 11 refills | Status: DC | PRN

## 2022-10-23 NOTE — Telephone Encounter (Signed)
MED REFILL REQUEST  sildenafil (VIAGRA) 100 MG tablet   [id number] [address], [place] AT [facility] Phone: [phone]  Fax: [phone]

## 2022-10-24 ENCOUNTER — Other Ambulatory Visit: Payer: Self-pay

## 2022-10-24 DIAGNOSIS — I10 Essential (primary) hypertension: Secondary | ICD-10-CM

## 2022-10-25 MED ORDER — LISINOPRIL-HYDROCHLOROTHIAZIDE 20-25 MG PO TABS: 1.0000 | ORAL_TABLET | Freq: Every day | ORAL | 3 refills | Status: DC

## 2023-01-19 ENCOUNTER — Other Ambulatory Visit: Payer: Self-pay

## 2023-01-19 ENCOUNTER — Ambulatory Visit (INDEPENDENT_AMBULATORY_CARE_PROVIDER_SITE_OTHER): Payer: 59 | Admitting: Student

## 2023-01-19 ENCOUNTER — Encounter: Payer: Self-pay | Admitting: Student

## 2023-01-19 ENCOUNTER — Telehealth: Payer: Self-pay | Admitting: *Deleted

## 2023-01-19 VITALS — BP 118/73 | HR 73 | Temp 98.3°F | Ht 70.0 in | Wt 186.8 lb

## 2023-01-19 DIAGNOSIS — R109 Unspecified abdominal pain: Secondary | ICD-10-CM | POA: Insufficient documentation

## 2023-01-19 DIAGNOSIS — R1030 Lower abdominal pain, unspecified: Secondary | ICD-10-CM

## 2023-01-19 MED ORDER — DICYCLOMINE HCL 10 MG PO CAPS: 10.0000 mg | ORAL_CAPSULE | Freq: Three times a day (TID) | ORAL | 0 refills | Status: AC

## 2023-01-19 NOTE — Patient Instructions (Signed)
Thank you so much for coming to the clinic today!   I believe you have some inflammation in your small intestine, so everytime it contracts it causes that pain. I am sending in a medication to take that should help ease those contractions. If you start developing fevers, chills, nausea, or vomiting, please present to the emergency department   If you have any questions please feel free to the call the clinic at anytime at [phone]. It was a pleasure seeing you!  Best, Dr. Sanjuana Mae

## 2023-01-19 NOTE — Progress Notes (Signed)
CC: Abdominal pain  HPI:  [name] is a 62 y.o. male living with a history stated below and presents today for abdominal pain. Please see problem based assessment and plan for additional details.  Past Medical History:  Diagnosis Date   Back pain    Hypertension     Current Outpatient Medications on File Prior to Visit  Medication Sig Dispense Refill   amLODipine (NORVASC) 5 MG tablet Take 1 tablet (5 mg total) by mouth daily. 90 tablet 2   aspirin EC 81 MG tablet Take 1 tablet (81 mg total) by mouth daily. 365 tablet 0   atorvastatin (LIPITOR) 20 MG tablet TAKE 1 TABLET(20 MG) BY MOUTH DAILY 90 tablet 3   lisinopril-hydrochlorothiazide (ZESTORETIC) 20-25 MG tablet Take 1 tablet by mouth daily. 90 tablet 3   meloxicam (MOBIC) 15 MG tablet Take 1 tablet by mouth ONCE A DAY AS NEEDED 30 tablet 0   methocarbamol (ROBAXIN) 500 MG tablet TAKE 1 TABLET(500 MG) BY MOUTH EVERY 6 HOURS AS NEEDED FOR MUSCLE SPASMS 56 tablet 0   Multiple Vitamin (MULTIVITAMIN) tablet Take 1 tablet by mouth daily.     sildenafil (VIAGRA) 100 MG tablet Take 0.5-1 tablets (50-100 mg total) by mouth daily as needed for erectile dysfunction. 10 tablet 11   No current facility-administered medications on file prior to visit.    No family history on file.  Social History   Socioeconomic History   Marital status: Single    Spouse name: Not on file   Number of children: Not on file   Years of education: Not on file   Highest education level: Not on file  Occupational History   Not on file  Tobacco Use   Smoking status: Every Day    Packs/day: 0.75    Years: 36.00    Total pack years: 27.00    Types: Cigarettes   Smokeless tobacco: Never  Substance and Sexual Activity   Alcohol use: No    Comment: Quit January 2016   Drug use: No   Sexual activity: Never  Other Topics Concern   Not on file  Social History Narrative   Not on file   Social Determinants of Health   Financial Resource  Strain: Not on file  Food Insecurity: Not on file  Transportation Needs: Not on file  Physical Activity: Not on file  Stress: Not on file  Social Connections: Not on file  Intimate Partner Violence: Not on file    Review of Systems: ROS negative except for what is noted on the assessment and plan.  Vitals:   01/19/23 1102  BP: 118/73  Pulse: 73  Temp: 98.3 F (36.8 C)  TempSrc: Oral  SpO2: 94%  Weight: 186 lb 12.8 oz (84.7 kg)  Height: '5\' 10"'$  (1.778 m)    Physical Exam: Constitutional: well-appearing male, in no acute distress HENT: normocephalic atraumatic, mucous membranes moist Eyes: conjunctiva non-erythematous Neck: supple Cardiovascular: regular rate and rhythm, no m/r/g Pulmonary/Chest: normal work of breathing on room air, lungs clear to auscultation bilaterally Abdominal: tenderness on deep palpation between the nasal cavity and pubic bone. Tenderness is not present in any other location of the abdomen. Bowel sounds are hypoactive. Murphy's test is negative.     Assessment & Plan:   Abdominal pain Patient presents with abdominal pain that started on Wednesday night.  Sunday night patient had a medium or steak and had a few alcoholic beverages. Monday and Tuesday he started to develop some diarrhea, and  Wednesday night pain started.  He states the pain is intermittent and denies any exacerbating or relieving factors.  It is located right below the navel cavity and above the pubic bone in the central abdomen, and is elicited by deep palpation.  He has been able to eat normally.  He denies any nausea or vomiting, and his last bowel movement was diarrhea on Tuesday.  He has been trying to force himself to have a bowel movement however has not been able to.  He denies any urinary symptoms such as decreased stream or dysuria.  He also denies any fevers, chills, and weight loss.  On physical exam, there is tenderness on deep palpation between the nasal cavity and pubic bone.   Tenderness is not present in any other location of the abdomen.  Bowel sounds are hypoactive.  Murphy's test is negative.  Plan: - At the moment, given intermittent diarrhea and now what may be constipation, differentials are broad.  It seems like he had gastroenteritis from the steak that he ate earlier in the week.  Intermittent diarrhea and constipation could also be signs of irritable bowel syndrome.  Location of pain could also be suspicious for diverticulitis. - Will treat patient with trial of Bentyl and see if this helps relieve the pain - Advised patient that if he develops vomiting, fevers, bloody bowel movements to present to the emergency department  Patient discussed with Dr. Leonard Downing Catera Hankins, M.D. [facility], PGY-1 Phone: [phone] Date 01/23/2023 Time 2:09 PM

## 2023-01-19 NOTE — Telephone Encounter (Signed)
Patient walked in with c/o of abdominal pain that stared on Wednesday pm.   Pain was off and on on Thursday.  Painful when trying t get out of bed .  Pain is like muscle spasms and contacting around belly button.  Pain in abdomen when moving around.  Had a small bowel movement that relaxed abdominal pain a little.  Has specimen with him-green.  No fever or nausea  or vomiting.  Given an appointment this morning.

## 2023-01-19 NOTE — Assessment & Plan Note (Signed)
Patient presents with abdominal pain that started on Wednesday night.  Sunday night patient had a medium or steak and had a few alcoholic beverages. Monday and Tuesday he started to develop some diarrhea, and Wednesday night pain started.  He states the pain is intermittent and denies any exacerbating or relieving factors.  It is located right below the navel cavity and above the pubic bone in the central abdomen, and is elicited by deep palpation.  He has been able to eat normally.  He denies any nausea or vomiting, and his last bowel movement was diarrhea on Tuesday.  He has been trying to force himself to have a bowel movement however has not been able to.  He denies any urinary symptoms such as decreased stream or dysuria.  He also denies any fevers, chills, and weight loss.  On physical exam, there is tenderness on deep palpation between the nasal cavity and pubic bone.  Tenderness is not present in any other location of the abdomen.  Bowel sounds are hypoactive.  Murphy's test is negative.  Plan: - At the moment, given intermittent diarrhea and now what may be constipation, differentials are broad.  It seems like he had gastroenteritis from the steak that he ate earlier in the week.  Intermittent diarrhea and constipation could also be signs of irritable bowel syndrome.  Location of pain could also be suspicious for diverticulitis. - Will treat patient with trial of Bentyl and see if this helps relieve the pain - Advised patient that if he develops vomiting, fevers, bloody bowel movements to present to the emergency department

## 2023-02-10 NOTE — Progress Notes (Signed)
Internal Medicine Clinic Attending  Case discussed with Dr. Sanjuana Mae  At the time of the visit.  We reviewed the resident's history and exam and pertinent patient test results.  I agree with the assessment, diagnosis, and plan of care documented in the resident's note. There are typographical errors in note - pain is meant to be described as located between [employer] and pubic bone.  Appropriate return precautions noted for this intermittent abdominal pain which could represent early sbo, diverticulitis, enteritis, or IBS, as Dr. Wyvonna Plum has noted.  Watchful waiting employed.

## 2023-03-22 ENCOUNTER — Other Ambulatory Visit: Payer: Self-pay | Admitting: Student

## 2023-03-22 DIAGNOSIS — G8929 Other chronic pain: Secondary | ICD-10-CM

## 2023-05-01 ENCOUNTER — Other Ambulatory Visit: Payer: Self-pay | Admitting: Internal Medicine

## 2023-05-01 DIAGNOSIS — G8929 Other chronic pain: Secondary | ICD-10-CM

## 2023-05-28 ENCOUNTER — Other Ambulatory Visit: Payer: Self-pay | Admitting: Internal Medicine

## 2023-05-28 DIAGNOSIS — E785 Hyperlipidemia, unspecified: Secondary | ICD-10-CM

## 2023-05-28 DIAGNOSIS — G8929 Other chronic pain: Secondary | ICD-10-CM

## 2023-05-28 MED ORDER — ATORVASTATIN CALCIUM 20 MG PO TABS
ORAL_TABLET | ORAL | 3 refills | Status: DC
Start: 2023-05-28 — End: 2024-08-04

## 2023-07-10 ENCOUNTER — Other Ambulatory Visit: Payer: Self-pay | Admitting: Internal Medicine

## 2023-07-10 DIAGNOSIS — G8929 Other chronic pain: Secondary | ICD-10-CM

## 2023-08-28 ENCOUNTER — Other Ambulatory Visit: Payer: Self-pay | Admitting: Internal Medicine

## 2023-08-28 DIAGNOSIS — G8929 Other chronic pain: Secondary | ICD-10-CM

## 2023-08-29 ENCOUNTER — Other Ambulatory Visit: Payer: Self-pay

## 2023-08-29 DIAGNOSIS — I10 Essential (primary) hypertension: Secondary | ICD-10-CM

## 2023-08-29 MED ORDER — AMLODIPINE BESYLATE 5 MG PO TABS
5.0000 mg | ORAL_TABLET | Freq: Every day | ORAL | 0 refills | Status: DC
Start: 2023-08-29 — End: 2023-09-20

## 2023-09-20 ENCOUNTER — Telehealth: Payer: Self-pay | Admitting: *Deleted

## 2023-09-20 ENCOUNTER — Ambulatory Visit (INDEPENDENT_AMBULATORY_CARE_PROVIDER_SITE_OTHER): Payer: Self-pay | Admitting: Internal Medicine

## 2023-09-20 ENCOUNTER — Encounter: Payer: Self-pay | Admitting: Internal Medicine

## 2023-09-20 VITALS — BP 132/75 | HR 66 | Temp 97.5°F | Ht 70.0 in | Wt 189.8 lb

## 2023-09-20 DIAGNOSIS — Z23 Encounter for immunization: Secondary | ICD-10-CM | POA: Diagnosis not present

## 2023-09-20 DIAGNOSIS — R7303 Prediabetes: Secondary | ICD-10-CM | POA: Diagnosis not present

## 2023-09-20 DIAGNOSIS — M545 Low back pain, unspecified: Secondary | ICD-10-CM

## 2023-09-20 DIAGNOSIS — Z13220 Encounter for screening for lipoid disorders: Secondary | ICD-10-CM

## 2023-09-20 DIAGNOSIS — Z1211 Encounter for screening for malignant neoplasm of colon: Secondary | ICD-10-CM

## 2023-09-20 DIAGNOSIS — L821 Other seborrheic keratosis: Secondary | ICD-10-CM | POA: Insufficient documentation

## 2023-09-20 DIAGNOSIS — R42 Dizziness and giddiness: Secondary | ICD-10-CM

## 2023-09-20 DIAGNOSIS — Z0000 Encounter for general adult medical examination without abnormal findings: Secondary | ICD-10-CM

## 2023-09-20 DIAGNOSIS — G8929 Other chronic pain: Secondary | ICD-10-CM

## 2023-09-20 NOTE — Assessment & Plan Note (Signed)
Not on therapy for prediabetes.  -Check BMP, lipid profile

## 2023-09-20 NOTE — Telephone Encounter (Signed)
Patient walked in today.  Missed scheduled appointment this morning to talk about his meds.  C/O dizziness when he climbs up ladders  and gets up from lying position at his job.  Has been going on for a few weeks.  Got a recent refill on his meds and thinks the doses may have been changed.  No available appointments for this afternoon.  Would like to wait and see if there are any cancellations for this afternoon.

## 2023-09-20 NOTE — Assessment & Plan Note (Addendum)
Patient reports intermittent episodes of lightheadedness when coming from a supine to standing position or when climbing tall ladders. He denies LOC, chest pain, palpitations, history of vertigo. He takes amlodipine 5mg  and lisinopril-hydrochlorothiazide 10-25 daily. He has been told at previous ED visits he was dehydrated and hypotensive. He was seen in the clinic 6 months ago for the same complaint, at the time oral hydration was recommended. Orthostatic vital signs were obtained today and were negative.  -Discontinue amlodipine, continue Zestoretic. If symptoms continue, discontinue Zestoretic as well -Check CBC -Encouraged oral hydration

## 2023-09-20 NOTE — Progress Notes (Signed)
    Subjective:  CC: Lightheadedness  HPI:  [name] is a 62 y.o. male with a past medical history stated below and presents today for above. Please see problem based assessment and plan for additional details.  Past Medical History:  Diagnosis Date   Back pain    Hypertension     Current Outpatient Medications on File Prior to Visit  Medication Sig Dispense Refill   aspirin EC 81 MG tablet Take 1 tablet (81 mg total) by mouth daily. 365 tablet 0   atorvastatin (LIPITOR) 20 MG tablet TAKE 1 TABLET(20 MG) BY MOUTH DAILY 90 tablet 3   dicyclomine (BENTYL) 10 MG capsule Take 1 capsule (10 mg total) by mouth 4 (four) times daily -  before meals and at bedtime. 40 capsule 0   lisinopril-hydrochlorothiazide (ZESTORETIC) 20-25 MG tablet Take 1 tablet by mouth daily. 90 tablet 3   meloxicam (MOBIC) 15 MG tablet TAKE 1 TABLET BY MOUTH EVERY DAY AS NEEDED 30 tablet 0   methocarbamol (ROBAXIN) 500 MG tablet TAKE 1 TABLET(500 MG) BY MOUTH EVERY 6 HOURS AS NEEDED FOR MUSCLE SPASMS 56 tablet 0   Multiple Vitamin (MULTIVITAMIN) tablet Take 1 tablet by mouth daily.     sildenafil (VIAGRA) 100 MG tablet Take 0.5-1 tablets (50-100 mg total) by mouth daily as needed for erectile dysfunction. 10 tablet 11   No current facility-administered medications on file prior to visit.    Review of Systems: ROS negative except for as is noted on the assessment and plan.  Objective:   Vitals:   09/20/23 1316  BP: 132/75  Pulse: 66  Temp: (!) 97.5 F (36.4 C)  TempSrc: Oral  SpO2: 97%  Weight: 189 lb 12.8 oz (86.1 kg)  Height: 5\' 10"  (1.778 m)    Physical Exam: Constitutional: well-appearing, in no acute distress HENT: normocephalic atraumatic, mucous membranes moist Eyes: conjunctiva non-erythematous Neck: supple Cardiovascular: regular rate and rhythm, no m/r/g Pulmonary/Chest: normal work of breathing on room air, lungs clear to auscultation bilaterally Abdominal: soft, non-tender,  non-distended MSK: normal bulk and tone Neurological: alert & oriented x 3, 5/5 strength in bilateral upper and lower extremities, normal gait Skin: warm and dry  Assessment & Plan:   Lightheadedness Patient reports intermittent episodes of lightheadedness when coming from a supine to standing position or when climbing tall ladders. He denies LOC, chest pain, palpitations, history of vertigo. He takes amlodipine 5mg  and lisinopril-hydrochlorothiazide 10-25 daily. He has been told at previous ED visits he was dehydrated and hypotensive. He was seen in the clinic 6 months ago for the same complaint, at the time oral hydration was recommended. Orthostatic vital signs were obtained today and were negative.  -Discontinue amlodipine, continue Zestoretic. If symptoms continue, discontinue Zestoretic as well -Check CBC -Encouraged oral hydration  Seborrheic keratosis Patient was concerned about a crusted growth on his right back. It was an identified as a subhorrheic keratosis, patient was informed this is a common and benign finding. No dermatology follow up needed.    Healthcare maintenance Patient is interested in colon cancer screening. Referral to Gi for colonoscopy placed.   Pre-diabetes Not on therapy for prediabetes.  -Check BMP, lipid profile     Patient seen with Dr. Victorino December MD [facility]  PGY-1 Pager: [phone] Date 09/20/2023  Time 6:58 PM

## 2023-09-20 NOTE — Assessment & Plan Note (Signed)
Patient is interested in colon cancer screening. Referral to Gi for colonoscopy placed.

## 2023-09-20 NOTE — Assessment & Plan Note (Addendum)
Patient was concerned about a crusted growth on his right back. It was an identified as a subhorrheic keratosis, patient was informed this is a common and benign finding. No dermatology follow up needed.

## 2023-09-20 NOTE — Patient Instructions (Addendum)
Derek Carroll,   It was a pleasure meeting you today.   For your lightheadedness, stop taking your amlodipine. If you continue to have lightheadedness, stop taking your lisinopril-hydrochlorothiazide as well. If you continue to have symptoms after that, please call the clinic to schedule another appointment.   I will call you with the results of your labwork.   Thanks,  Dr Carlynn Purl

## 2023-09-21 LAB — LIPID PANEL
Chol/HDL Ratio: 2.6 ratio (ref 0.0–5.0)
Cholesterol, Total: 95 mg/dL — ABNORMAL LOW (ref 100–199)
HDL: 36 mg/dL — ABNORMAL LOW (ref >39)
LDL Chol Calc (NIH): 29 mg/dL (ref 0–99)
Triglycerides: 186 mg/dL — ABNORMAL HIGH (ref 0–149)
VLDL Cholesterol Cal: 30 mg/dL (ref 5–40)

## 2023-09-21 LAB — CBC
Hematocrit: 45 % (ref 37.5–51.0)
Hemoglobin: 15 g/dL (ref 13.0–17.7)
MCH: 32.6 pg (ref 26.6–33.0)
MCHC: 33.3 g/dL (ref 31.5–35.7)
MCV: 98 fL — ABNORMAL HIGH (ref 79–97)
Platelets: 324 10*3/uL (ref 150–450)
RBC: 4.6 x10E6/uL (ref 4.14–5.80)
RDW: 11.8 % (ref 11.6–15.4)
WBC: 6.6 10*3/uL (ref 3.4–10.8)

## 2023-09-21 LAB — BMP8+ANION GAP
Anion Gap: 13 mmol/L (ref 10.0–18.0)
BUN/Creatinine Ratio: 24 (ref 10–24)
BUN: 22 mg/dL (ref 8–27)
CO2: 25 mmol/L (ref 20–29)
Calcium: 9.3 mg/dL (ref 8.6–10.2)
Chloride: 101 mmol/L (ref 96–106)
Creatinine, Ser: 0.92 mg/dL (ref 0.76–1.27)
Glucose: 100 mg/dL — ABNORMAL HIGH (ref 70–99)
Potassium: 4.3 mmol/L (ref 3.5–5.2)
Sodium: 139 mmol/L (ref 134–144)
eGFR: 93 mL/min/{1.73_m2} (ref >59)

## 2023-09-24 NOTE — Progress Notes (Signed)
Internal Medicine Clinic Attending  I was physically present during the key portions of the resident provided service and participated in the medical decision making of patient's management care. I reviewed pertinent patient test results.  The assessment, diagnosis, and plan were formulated together and I agree with the documentation in the resident's note.  Erlinda Hong, MD FACP

## 2023-12-19 ENCOUNTER — Other Ambulatory Visit: Payer: Self-pay | Admitting: Internal Medicine

## 2023-12-19 DIAGNOSIS — N529 Male erectile dysfunction, unspecified: Secondary | ICD-10-CM

## 2023-12-19 DIAGNOSIS — G8929 Other chronic pain: Secondary | ICD-10-CM

## 2023-12-19 DIAGNOSIS — I10 Essential (primary) hypertension: Secondary | ICD-10-CM

## 2024-03-25 ENCOUNTER — Other Ambulatory Visit: Payer: Self-pay | Admitting: Internal Medicine

## 2024-03-25 DIAGNOSIS — I10 Essential (primary) hypertension: Secondary | ICD-10-CM

## 2024-08-04 ENCOUNTER — Ambulatory Visit (INDEPENDENT_AMBULATORY_CARE_PROVIDER_SITE_OTHER): Payer: Self-pay

## 2024-08-04 ENCOUNTER — Other Ambulatory Visit: Payer: Self-pay

## 2024-08-04 VITALS — BP 139/69 | HR 73 | Temp 97.9°F | Ht 70.0 in | Wt 202.4 lb

## 2024-08-04 DIAGNOSIS — I10 Essential (primary) hypertension: Secondary | ICD-10-CM

## 2024-08-04 DIAGNOSIS — R410 Disorientation, unspecified: Secondary | ICD-10-CM

## 2024-08-04 DIAGNOSIS — L570 Actinic keratosis: Secondary | ICD-10-CM | POA: Diagnosis not present

## 2024-08-04 DIAGNOSIS — Z1211 Encounter for screening for malignant neoplasm of colon: Secondary | ICD-10-CM

## 2024-08-04 DIAGNOSIS — F17210 Nicotine dependence, cigarettes, uncomplicated: Secondary | ICD-10-CM

## 2024-08-04 DIAGNOSIS — Z79899 Other long term (current) drug therapy: Secondary | ICD-10-CM

## 2024-08-04 DIAGNOSIS — F17200 Nicotine dependence, unspecified, uncomplicated: Secondary | ICD-10-CM

## 2024-08-04 DIAGNOSIS — N529 Male erectile dysfunction, unspecified: Secondary | ICD-10-CM

## 2024-08-04 DIAGNOSIS — Z87891 Personal history of nicotine dependence: Secondary | ICD-10-CM

## 2024-08-04 DIAGNOSIS — Z23 Encounter for immunization: Secondary | ICD-10-CM | POA: Diagnosis not present

## 2024-08-04 DIAGNOSIS — E785 Hyperlipidemia, unspecified: Secondary | ICD-10-CM | POA: Diagnosis not present

## 2024-08-04 DIAGNOSIS — Z135 Encounter for screening for eye and ear disorders: Secondary | ICD-10-CM

## 2024-08-04 MED ORDER — CYCLOBENZAPRINE HCL 10 MG PO TABS: 10.0000 mg | ORAL_TABLET | Freq: Three times a day (TID) | ORAL | 0 refills | Status: AC | PRN

## 2024-08-04 MED ORDER — ATORVASTATIN CALCIUM 20 MG PO TABS: ORAL_TABLET | ORAL | 3 refills | Status: AC

## 2024-08-04 MED ORDER — LISINOPRIL-HYDROCHLOROTHIAZIDE 20-25 MG PO TABS
1.0000 | ORAL_TABLET | Freq: Every day | ORAL | 3 refills | Status: AC
Start: 2024-08-04 — End: 2025-08-04

## 2024-08-04 MED ORDER — SILDENAFIL CITRATE 100 MG PO TABS: 100.0000 mg | ORAL_TABLET | ORAL | 3 refills | Status: AC | PRN

## 2024-08-04 NOTE — Progress Notes (Signed)
 Established Patient Office Visit  Subjective   Patient ID: Derek Carroll, male    DOB: 1961/04/27  Age: 63 y.o. MRN: [id number]  Patient last seen in 08/2023.  At this time his amlodipine  was stopped as it was thought to be contributing to lightheadedness.  Patient currently takes lisinopril -HCTZ for his hypertension, atorvastatin  for hyperlipidemia, aspirin  for primary prevention, Viagra  as needed, Flexeril  as needed for back pain, multivitamin, and doxylamine for sleep.  His only complaint is new onset confusion over the last few months.        Objective:     BP 139/69 (BP Location: Right Arm, Patient Position: Sitting, Cuff Size: Large)   Pulse 73   Temp 97.9 F (36.6 C) (Oral)   Ht 5' 10 (1.778 m)   Wt 202 lb 6.4 oz (91.8 kg)   SpO2 95%   BMI 29.04 kg/m    Physical Exam Vitals reviewed.  Constitutional:      Appearance: Normal appearance.  HENT:     Nose: Nose normal.  Eyes:     Conjunctiva/sclera: Conjunctivae normal.  Cardiovascular:     Rate and Rhythm: Normal rate and regular rhythm.     Heart sounds: Normal heart sounds.  Pulmonary:     Effort: Pulmonary effort is normal.  Musculoskeletal:     Right lower leg: No edema.     Left lower leg: No edema.  Skin:    General: Skin is warm.     Comments: Diffuse actinic damage in sun exposed areas, see photos below  Neurological:     General: No focal deficit present.     Mental Status: He is alert and oriented to person, place, and time.  Psychiatric:        Mood and Affect: Mood normal.        Behavior: Behavior normal.        No results found for any visits on 08/04/24.    The ASCVD Risk score (Arnett DK, et al., 2019) failed to calculate for the following reasons:   The valid total cholesterol range is 130 to 320 mg/dL    Assessment & Plan:   Assessment & Plan Confusion Patient says that he has noticed he has been more confused at work over the last 3 months.  He says it is not dizziness  but that he feels like he has brain fog and cannot concentrate as much.  It is not worse at a certain time of day and he has not tried anything for his confusion.  He wonders if it is due to stopping his amlodipine  at the last visit.  Patient works from 3 PM until around 2 to 4 AM.  He gets home and takes doxylamine and eats a hamburger helper meal in bed and watches TV until he falls asleep.  He usually does not fall asleep until around midday and soon has to wake up to go back to work.  He does feel very tired.  His diet consists of sandwiches and hamburger helper meals, he does not eat fruits or vegetables, he drinks Gatorade zeros and several sunny days a day.  He has been using over-the-counter doxylamine to help him fall asleep for several years.  I imagine that his confusion is due to lack of sleep and or chronic doxylamine use as this is a first generation antihistamine.  We discussed sleep hygiene and I encouraged the patient to try taking less doxylamine and instead switch it for melatonin as this will simulate  a more natural sleep-wake cycle.  We also discussed the negative side effects to taking first-generation antihistamines for the long-term.  I also encouraged the patient to drink less juice and eat more fruits and vegetables.  Plan to stop doxylamine over time and replace with melatonin, do not fall asleep with the TV on, and do not watch TV or eat in bed.  Reassess at follow-up.  Consider evaluating symptoms of obstructive sleep apnea in the future.    Essential hypertension Amlodipine  stopped at last office visit.  At this time in 08/2023 BMP normal.  Blood pressure today 139/69.  Plan to continue current regimen and refills given today.  Will recheck BMP today. Orders:   lisinopril -hydrochlorothiazide  (ZESTORETIC ) 20-25 MG tablet; Take 1 tablet by mouth daily.   Basic metabolic panel with GFR  Dyslipidemia Last lipid panel in 08/2023, cholesterol decreased at 95, HDL decreased 36,  triglycerides increased 186, LDL within normal limits.  Plan to continue atorvastatin  20 mg daily and will recheck lipid panel today. Orders:   atorvastatin  (LIPITOR) 20 MG tablet; TAKE 1 TABLET(20 MG) BY MOUTH DAILY   Lipid Profile  Actinic keratosis Patient has strawberry blonde hair, blue eyes and fair skin.  Diffuse actinic damage on bilateral upper extremities, face and neck.  History of melanoma on left chest.  On exam today, His bilateral dorsal upper extremities are studied with erythematous, scaly and hyperkeratotic papules.  Differential includes actinic keratoses, BCC, SCC, SKs.  Discussed importance of every day sunscreen use.  Will place referral to dermatology. Orders:   Ambulatory referral to Dermatology  Colon cancer screening Patient thinks the last time he had this done was when he was in his 38s.  No record on file.  Patient denies changes in bowel movements or recent weight loss.  Diet consist of sandwiches and hamburger helpers, has been smoking cigarettes for greater than 20 years.  Referral for screening colonoscopy placed today. Orders:   Amb Referral to Colonoscopy  Tobacco use disorder Significant smoking history, has been smoking for more than 20 years.  Patient is not interested in trying to quit at this time.  Patient was recently seen at urgent care due to low back pain and was given steroid injection.  He noticed at this time that his oxygen was 91%, he wondered if this had anything to do with his recent confusion.  We discussed that his low normal oxygen saturation was likely due to chronic lung disease in the setting of long-term tobacco use.  Oxygen saturation 95% today.  Discussed the negative effects of smoking on every aspect of his health.  Encouraged him to reach out to us  when he is interested in stopping as we can refer him to community programs as well as prescribe medications to help cessation.  Educated patient on recommendations for low-dose CT scan in  patients with smoking history or currently smoking.  Patient agreeable and referral placed. Orders:   CT CHEST LUNG CA SCREEN LOW DOSE W/O CM; Future  Encounter for screening for eye and ear disorders Patient has not had a new prescription for his glasses in over 10 years, he would prefer us  to place an optometry referral to get new glasses. Orders:   Ambulatory referral to Optometry  Erectile dysfunction, unspecified erectile dysfunction type Patient only uses this as needed and needs a refill, will refill today. Orders:   sildenafil  (VIAGRA ) 100 MG tablet; Take 1 tablet (100 mg total) by mouth as needed for erectile dysfunction.  Encounter  for immunization Given today. Orders:   Flu vaccine trivalent PF, 6mos and older(Flulaval,Afluria,Fluarix,Fluzone)   Return in about 3 months (around 11/03/2024) for sleep follow up.    Viktoria King, DO

## 2024-08-04 NOTE — Assessment & Plan Note (Signed)
 Patient only uses this as needed and needs a refill, will refill today. Orders:   sildenafil  (VIAGRA ) 100 MG tablet; Take 1 tablet (100 mg total) by mouth as needed for erectile dysfunction.

## 2024-08-04 NOTE — Assessment & Plan Note (Signed)
 Amlodipine  stopped at last office visit.  At this time in 08/2023 BMP normal.  Blood pressure today 139/69.  Plan to continue current regimen and refills given today.  Will recheck BMP today. Orders:   lisinopril -hydrochlorothiazide  (ZESTORETIC ) 20-25 MG tablet; Take 1 tablet by mouth daily.   Basic metabolic panel with GFR

## 2024-08-04 NOTE — Assessment & Plan Note (Signed)
 Last lipid panel in 08/2023, cholesterol decreased at 95, HDL decreased 36, triglycerides increased 186, LDL within normal limits.  Plan to continue atorvastatin  20 mg daily and will recheck lipid panel today. Orders:   atorvastatin  (LIPITOR) 20 MG tablet; TAKE 1 TABLET(20 MG) BY MOUTH DAILY   Lipid Profile

## 2024-08-04 NOTE — Patient Instructions (Addendum)
 It was wonderful seeing you today!   We talked about.SABRASABRA  1) Your sleep, try to switch your over-the-counter sleep medicine (doxylamine) with melatonin. You can purchase melatonin. And here are some helpful sleep hygiene tips:  -Aim to go to bed and wake up at the same times each day, even on days off, to stabilize circadian rhythms.  -Avoid eating meals, especially heavy or processed foods, in bed or close to bedtime. Eating in bed and late night meals can disrupt sleep onset and quality. -Remove televisions and other screens from the bedroom. Do not watch TV or use electronic devices in bed. -Avoid screens (TV, phone, tablet) for at least 30-60 minutes before intended sleep time. -Make the bedroom dark, quiet, and cool. Use blackout curtains and consider white noise if needed. -Limit caffeine intake to the early part of the shift and avoid it for at least 6 hours before sleep.  2) Someone should call you to schedule your dermatology appointment, colonoscopy screening appointment, lung cancer screening appointment, and optometry appointment.   3) We took labs looking at your cholesterol and electrolytes. I will call you with the results.  4) Please think about cutting back on smoking to improve every aspect of your health.   And don't forget to get your flu shot before the end of October!  If you have any questions please feel free to the call the clinic at anytime at [phone].  Have a blessed day,  Dr. Charmayne

## 2024-08-05 ENCOUNTER — Ambulatory Visit: Payer: Self-pay

## 2024-08-05 LAB — BASIC METABOLIC PANEL WITH GFR
BUN/Creatinine Ratio: 24 (ref 10–24)
BUN: 21 mg/dL (ref 8–27)
CO2: 23 mmol/L (ref 20–29)
Calcium: 9.4 mg/dL (ref 8.6–10.2)
Chloride: 101 mmol/L (ref 96–106)
Creatinine, Ser: 0.89 mg/dL (ref 0.76–1.27)
Glucose: 105 mg/dL — ABNORMAL HIGH (ref 70–99)
Potassium: 4.2 mmol/L (ref 3.5–5.2)
Sodium: 137 mmol/L (ref 134–144)
eGFR: 96 mL/min/1.73 (ref >59)

## 2024-08-05 LAB — LIPID PANEL
Chol/HDL Ratio: 3 ratio (ref 0.0–5.0)
Cholesterol, Total: 97 mg/dL — ABNORMAL LOW (ref 100–199)
HDL: 32 mg/dL — ABNORMAL LOW (ref >39)
LDL Chol Calc (NIH): 33 mg/dL (ref 0–99)
Triglycerides: 199 mg/dL — ABNORMAL HIGH (ref 0–149)
VLDL Cholesterol Cal: 32 mg/dL (ref 5–40)

## 2024-08-16 NOTE — Progress Notes (Signed)
 Internal Medicine Clinic Attending  Case discussed with the resident at the time of the visit.  We reviewed the resident's history and exam and pertinent patient test results.  I agree with the assessment, diagnosis, and plan of care documented in the resident's note.

## 2024-09-04 ENCOUNTER — Ambulatory Visit (HOSPITAL_COMMUNITY)

## 2024-11-03 ENCOUNTER — Telehealth: Payer: Self-pay | Admitting: Internal Medicine

## 2024-11-03 DIAGNOSIS — F17210 Nicotine dependence, cigarettes, uncomplicated: Secondary | ICD-10-CM

## 2024-11-03 DIAGNOSIS — Z122 Encounter for screening for malignant neoplasm of respiratory organs: Secondary | ICD-10-CM

## 2024-11-03 NOTE — Telephone Encounter (Signed)
 Please take a look at this current message.  A request has been sent to correct the pt's current order as he was sch on 09/04/2024 cancelled the appt with the [facility] location.    Copied from CRM #[id number]. Topic: Referral - Status >> Oct 31, 2024  3:38 PM Carrielelia G wrote: Corrected referral needed currently you have (follow up) and patient has never had an initial screening.   Exam should be:   [id number] - CT CHEST LUNG CANCER SCREENING LOW DOSE WO CONTRAST

## 2024-11-17 ENCOUNTER — Telehealth: Payer: Self-pay | Admitting: *Deleted

## 2024-11-17 NOTE — Telephone Encounter (Unsigned)
 Copied from CRM #[id number]. Topic: Clinical - Medication Prior Auth >> Nov 14, 2024 12:37 PM Debby BROCKS wrote: Reason for CRM: [facility] called in stating that the authorization for the patients CT scan on Tuesday has been expired since 10/01/2024. If a new authorization is not received by Monday 11/17/2024 at 10am then the appointment will need to be canceled. [facility] is currently closed.

## 2024-11-17 NOTE — Telephone Encounter (Signed)
 Received a phone call from Consuelo this morning with [facility] in reference to message below.  Made her aware that my referral coordinator is not here at the moment.  She will hold the appointment until 12 pm today before cancelling.  Forwarding message to Chilon to follow up.  Bonnie's call back number is [phone].

## 2024-11-18 ENCOUNTER — Inpatient Hospital Stay: Admission: RE | Admit: 2024-11-18 | Source: Ambulatory Visit
